# Patient Record
Sex: Male | Born: 1955 | Race: White | Hispanic: No | Marital: Married | Smoking: Never smoker
Health system: Southern US, Community
[De-identification: ages and names within clinical notes are randomized; demographics above are authoritative.]

## PROBLEM LIST (undated history)

## (undated) DIAGNOSIS — G4700 Insomnia, unspecified: Secondary | ICD-10-CM

## (undated) DIAGNOSIS — F320 Major depressive disorder, single episode, mild: Secondary | ICD-10-CM

## (undated) DIAGNOSIS — E559 Vitamin D deficiency, unspecified: Secondary | ICD-10-CM

## (undated) DIAGNOSIS — F32A Depression, unspecified: Secondary | ICD-10-CM

## (undated) HISTORY — DX: Vitamin D deficiency, unspecified: E55.9

## (undated) HISTORY — PX: REFRACTIVE SURGERY: SHX103

## (undated) HISTORY — DX: Major depressive disorder, single episode, mild: F32.0

## (undated) HISTORY — PX: VASECTOMY: SHX75

## (undated) HISTORY — DX: Insomnia, unspecified: G47.00

## (undated) HISTORY — PX: MOHS SURGERY: SUR867

## (undated) HISTORY — DX: Depression, unspecified: F32.A

---

## 2011-01-10 ENCOUNTER — Ambulatory Visit: Payer: Self-pay | Admitting: Thoracic Surgery

## 2016-09-19 ENCOUNTER — Ambulatory Visit (INDEPENDENT_AMBULATORY_CARE_PROVIDER_SITE_OTHER): Payer: 59 | Admitting: Neurology

## 2016-09-19 ENCOUNTER — Encounter: Payer: Self-pay | Admitting: Neurology

## 2016-09-19 VITALS — BP 110/61 | HR 54 | Resp 14 | Ht 68.0 in | Wt 160.0 lb

## 2016-09-19 DIAGNOSIS — M2619 Other specified anomalies of jaw-cranial base relationship: Secondary | ICD-10-CM | POA: Diagnosis not present

## 2016-09-19 DIAGNOSIS — G4733 Obstructive sleep apnea (adult) (pediatric): Secondary | ICD-10-CM

## 2016-09-19 DIAGNOSIS — G4700 Insomnia, unspecified: Secondary | ICD-10-CM

## 2016-09-19 DIAGNOSIS — G4734 Idiopathic sleep related nonobstructive alveolar hypoventilation: Secondary | ICD-10-CM | POA: Diagnosis not present

## 2016-09-19 DIAGNOSIS — G4730 Sleep apnea, unspecified: Secondary | ICD-10-CM

## 2016-09-19 NOTE — Patient Instructions (Signed)
I would like Scott Rhodes to try melatonin at a dose of less than 6 mg 30 minutes before intended bedtime. This should be taken for 14 days consecutively to entrain a prolonged sleep phase. The goal is to stretch the overnight sleep time rather than to initiate sleep in the first place.

## 2016-09-19 NOTE — Progress Notes (Signed)
SLEEP MEDICINE CLINIC   Provider:  Larey Seat, M D  Referring Provider: Crist Infante, MD Primary Care Physician:  Jerlyn Ly, MD  Chief Complaint  Patient presents with  . Sleep Consult    Rm 11. Patient reports that he has had 2 sleep studies in the past. Unable to tolerate CPAP, O2, or dental device.    Chief complaint according to patient : Falling asleep is not my issue, but staying asleep. My sleep is not very restorative, refreshing. I am sleepy during movies and conversations.   HPI:  Scott Rhodes is a 61 y.o. male , seen here as a referral from Dr. Joylene Draft for a sleep medicine consultation. Scott Rhodes had been evaluated twice for insomnia and sleep disordered breathing. His last sleep study was around 2010 and was performed at [facility]. I do not have records available. I do have a copy of the CPAP titration report from 02/26/2008 when Scott Rhodes was 61 years old and presented with a history of respiratory allergies, hypersomnia and OSA diagnosed in June of the same year with an AHI of 20.2. He returned for CPAP treatment. In addition I would like to mention that his Epworth Sleepiness Scale at the time was endorsed at 19 out of 24 points. CPAP was titrated from 4-8 cm water and the patient was able to sleep supine with an AHI of 0.0 he also could reach REM sleep at 9.7% of the night. There was no tachybradycardia arrhythmia noted, a full face mask was initially used in the sleep lab but had asked to replace these with a nasal pillow mask. Scott Rhodes used a CPAP for a while , but lost the interface. He switched to a mouth guard, which was manufactured by Dr. Oneal Grout. He would lose this one , too !   Sleep habits are as follows:  Mr. Lay bedtime is between 10 and 10:30 PM and he is usually asleep rather promptly. He describes his bedroom is cool, quiet and dark and there is usually no intrusion of any stimuli. He does not watch TV in the bedroom. He shares a bedroom  with his wife but she sometimes goes to another room. His wife has not described being bothered by snoring, kicking or any an active dream enactment. He will stay asleep for 4-5 hours is out interruption. From then on he will wake up in interval usually he cannot go back to sleep at all. He wakes up spontaneously it is not the urge to urinate that wakes him ,  he uses the time for a bathroom break. Episodic he will suffer some pain in the left hip, and that seems to be aggravated if he rests on his left side. He usually prefers to sleep on his side, but wakes up on his back. Uses nasal strips," breath rite ",  Sleeps on one pillow. Sleeps flat. For 10 years he has taken Remeron which helped him to get back to sleep a little easier but no longer. He still feels a difference if he does not take the medication, though. On average she only gets about 5 hours of nocturnal sleep , usually awake at 4 AM and  he has to rise in the morning at 5 AM, spontaneously. Does not need an alarm. While he does not feel refreshed or restored he does not have any discomfort or pain that keeps him from sleeping, no diaphoresis or palpitations, no chest pain or shortness of breath. His wife still  reports that he snores and has some apnea, but she never witnessed when he took a CPAP mask off or when he may have taken out his dental guard.  He does nap when he tries not to fall asleep- paradoxically.  Sleep medical history and family sleep history: No history of tonsil, adenoidectomy or septoplasty no neck surgery. He was not a sleep walker. His father had sleep apnea. His children are now in their 54s and never had any sleep problems.  Social history:  Used to work for a [employer]. Now baseball coach. Non smoker,  ETOH: 1-3 drinks 2 -3 nights a week, wine only.  Caffeine - one soda every 2-3 per month !  One cup of energy drink in AM> energy drinks ; none.  No history of shift work    Review of Systems: Out  of a complete 14 system review, the patient complains of only the following symptoms, and all other reviewed systems are negative. Snoring, insomnia, nocturia times 1.  Epworth score 18 , Fatigue severity score 33  , depression score n/ a  Social History   Social History  . Marital status: Married    Spouse name: N/A  . Number of children: 3  . Years of education: college   Occupational History  . Retired     Social History Main Topics  . Smoking status: Never Smoker  . Smokeless tobacco: Never Used  . Alcohol use Yes  . Drug use: No  . Sexual activity: Not on file   Other Topics Concern  . Not on file   Social History Narrative   Drinks 2 cups of coffee a day     No family history on file.  Past Medical History:  Diagnosis Date  . Insomnia   . Mild depression ([place])   . Vitamin D deficiency     Past Surgical History:  Procedure Laterality Date  . MOHS SURGERY    . REFRACTIVE SURGERY    . VASECTOMY      Current Outpatient Prescriptions  Medication Sig Dispense Refill  . mirtazapine (REMERON) 7.5 MG tablet Take 7.5 mg by mouth at bedtime.    . tamsulosin (FLOMAX) 0.4 MG CAPS capsule      No current facility-administered medications for this visit.     Allergies as of 09/19/2016  . (No Known Allergies)    Vitals: BP 110/61   Pulse (!) 54   Resp 14   Ht 5\' 8"  (1.727 m)   Wt 160 lb (72.6 kg)   BMI 24.33 kg/m  Last Weight:  Wt Readings from Last 1 Encounters:  09/19/16 160 lb (72.6 kg)   [id number] mass index is 24.33 kg/m.     Last Height:   Ht Readings from Last 1 Encounters:  09/19/16 5\' 8"  (1.727 m)    Physical exam:  General: The patient is awake, alert and appears not in acute distress. The patient is well groomed. Head: Normocephalic, atraumatic. Neck is supple. Mallampati 2,  neck circumference:15. Nasal airflow patent, but congested  , TMJ is evident . Retrognathia is seen.  Cardiovascular:  Regular rate and rhythm, without  murmurs or  carotid bruit, and without distended neck veins. Respiratory: Lungs are clear to auscultation. Skin:  Without evidence of edema, or rash Trunk: BMI is 24, erect posture.   Neurologic exam : The patient is awake and alert, oriented to place and time.   Memory subjective described as intact.  Attention span & concentration ability appears normal.  Speech is fluent,  without dysarthria, dysphonia or aphasia.  Mood and affect are appropriate.  Cranial nerves: Pupils are equal and briskly reactive to light. Funduscopic exam without evidence of pallor or edema.  Extraocular movements  in vertical and horizontal planes intact and without nystagmus. Visual fields by finger perimetry are intact. Hearing to finger rub intact.   Facial sensation intact to fine touch.  Facial motor strength is symmetric and tongue and uvula move midline. Shoulder shrug was symmetrical.   Motor exam: Normal tone, muscle bulk and symmetric strength in all extremities. Strong and bilaterally symmetric grip strength, intact pinch strength.  Sensory:  Fine touch, pinprick and vibration were tested in all extremities.  Proprioception tested in the upper extremities was normal.  Coordination: Rapid alternating movements and Finger-to-nose maneuver  normal without evidence of ataxia, dysmetria or tremor.  Gait and station: Patient walks without assistive device and is able unassisted to climb up to the exam table. Strength within normal limits.  Stance is stable and normal. Tandem gait is unfragmented. Turns with  3 Steps. Romberg testing is negative.  Deep tendon reflexes: in the  upper and lower extremities are symmetric and intact. Babinski maneuver deferred ,   The patient was advised of the nature of the diagnosed sleep disorder , the treatment options and risks for general a health and wellness arising from not treating the condition.  I spent more than 45 minutes of face to face time with the patient. Greater than  50% of time was spent in counseling and coordination of care. We have discussed the diagnosis and differential and I answered the patient's questions.     Assessment:  After physical and neurologic examination, review of laboratory studies,  Personal review of imaging studies, reports of other /same  Imaging studies ,  Results of polysomnography/ neurophysiology testing and pre-existing records as far as provided in visit., my assessment is   1)  Untreated sleep apnea, according to the last sleep study available from 2009. Mr. Bostic experiences high degrees of daytime sleepiness, fatigue and his wife still notice him to snore and sometimes have apnea. He does not have the physical markers of a typical sleep apnea patient, he is slender he has a very good muscle tone, his upper airway is not narrowed and his neck circumference is well within normal range. I do have very slender patient however that still have sleep apnea. I would like for him to be reevaluated for the current degree of sleep apnea, which may be less or more than it was in 2009 I also would encourage him to try melatonin instead of a prescription drug to help him sleep longer. Melatonin does not induce sleep but helps to maintain sleep. There also prescription drugs that can help to prolong sleep, especially SSRIs family's  new generations.   2) Mr. Cisney practices excellent sleep hygiene he does not watch TV in the bedroom his bedroom is cool, quiet and dark. He does not drink caffeine in the later hours of the day, he doesn't eat late and he exercises regularly.     Plan:  Treatment plan and additional workup : would like Mr. Jung to try melatonin at a dose of less than 6 mg 30 minutes before intended bedtime. This should be taken for 14 days consecutively to entrain a prolonged sleep phase. The goal is to stretch the overnight sleep time rather than to initiate sleep in the first place. Hypoxemia testing . Workup for apnea with  insomnia, goal is to improve sleep duration and sleep quality. If sleep apnea is again confirmed I would like for him to try the appropriate measures depending on : oxygen desaturation is present with apnea or if his apnea is REM sleep dependent. He has recollectionof a remote ONO at [facility] and oxygen trial, I will ask for records.  I would like for him to continue REMeron es needed.   SPLIT night PSG ordered.      Asencion Partridge Delano Frate MD  09/19/2016   CC: Crist Infante, [place],  [id number]

## 2016-10-03 ENCOUNTER — Ambulatory Visit (INDEPENDENT_AMBULATORY_CARE_PROVIDER_SITE_OTHER): Payer: 59 | Admitting: Neurology

## 2016-10-03 DIAGNOSIS — G4733 Obstructive sleep apnea (adult) (pediatric): Secondary | ICD-10-CM | POA: Diagnosis not present

## 2016-10-03 DIAGNOSIS — G4700 Insomnia, unspecified: Secondary | ICD-10-CM

## 2016-10-03 DIAGNOSIS — M2619 Other specified anomalies of jaw-cranial base relationship: Secondary | ICD-10-CM

## 2016-10-03 DIAGNOSIS — G4734 Idiopathic sleep related nonobstructive alveolar hypoventilation: Secondary | ICD-10-CM

## 2016-10-03 DIAGNOSIS — G4730 Sleep apnea, unspecified: Secondary | ICD-10-CM

## 2016-10-04 ENCOUNTER — Telehealth: Payer: Self-pay | Admitting: Neurology

## 2016-10-04 DIAGNOSIS — Z9989 Dependence on other enabling machines and devices: Principal | ICD-10-CM

## 2016-10-04 DIAGNOSIS — G4733 Obstructive sleep apnea (adult) (pediatric): Secondary | ICD-10-CM

## 2016-10-04 NOTE — Procedures (Signed)
[address] [place], [place] [address] NAME: Scott Rhodes     DOB: 04/27/1966 MEDICAL RECORD [id number]   DOS: 10/03/16 REFERRING PHYSICIAN: Crist Infante, MD Study performed: HST/Out of Center Sleep test HISTORY: HPI:  Scott Rhodes is a 61 year old male patient who has been evaluated twice for insomnia and sleep disordered breathing. His last sleep study was around 2010 and was performed at [facility]. I do have a copy of the CPAP titration report from 02/26/2008 when Scott Rhodes was 61 years old and presented with a history of respiratory allergies, hypersomnia and OSA , after being diagnosed in June of the same year with an AHI of 20.2. His Epworth Sleepiness Scale at the time was endorsed at 19 /24 points. CPAP was titrated from 4-8 cm water and the patient was able to sleep supine with an AHI of 0.0, he also could reach REM sleep at 9.7% of the night. There was no tachy-bradycardia or arrhythmia noted. A full face mask was initially used in the sleep lab but had asked to replace these with a nasal pillow mask. Scott Rhodes used a CPAP for a while, but lost the interface. He switched to a mouth guard, which was manufactured by Dr. Oneal Grout, but he would lose this one during the night , too. This HST is meant to re-establish a diagnosis.   STUDY RESULTS: Total Recording Time: 7hr 38min. Total Apnea/Hypopnea Index (AHI) 22.2/hr. Average Oxygen Saturation: 94% Spo2, Lowest Oxygen Saturation: 80% SpO2, with 8 minutes of hypoxia Average Heart Rate: 49 bpm/ regular,   IMPRESSION: OSA is still present. There is moderate sleep apnea recorded without clinically significant desaturation.  RECOMMENDATION: Dental device or CPAP are valid options, but an ENT procedure is an alternative. I certify that I have reviewed the raw data recording prior to the issuance of this report in accordance with the standards of Accreditation of the American Academy of Sleep medicine (AASM). Larey Seat, MD    10-04-2016 Diplomat, American Board of Psychiatry and Neurology, Diplomat, American Board of Sleep Medicine Medical Director of [facility] at [facility], an AASM accredited facility.

## 2016-10-04 NOTE — Telephone Encounter (Signed)
Hallo, Scott Rhodes  Patient has again tested positive for sleep apnea, moderate severity , no significant hypoxemia associated.  Lets suggest a nasal mask for the patient, he has tried FFM and pillows before, as well as dental devices.  Let me know if this is acceptable to him   [facility], MD

## 2016-10-09 NOTE — Telephone Encounter (Signed)
Patient is willing to try the nasal mask, please placed order. Patient asked that we mail the order to him. I confirmed address.

## 2016-10-09 NOTE — Addendum Note (Signed)
Addended by: Larey Seat on: 10/09/2016 10:05 AM   Modules accepted: Orders

## 2016-10-09 NOTE — Telephone Encounter (Signed)
Eson nasal mask, fisher and paykel medium or small ? , fit to patient .

## 2016-10-09 NOTE — Telephone Encounter (Signed)
Order printed and I will mail to patient per patient request

## 2018-11-13 DIAGNOSIS — H52223 Regular astigmatism, bilateral: Secondary | ICD-10-CM | POA: Diagnosis not present

## 2018-11-13 DIAGNOSIS — H43823 Vitreomacular adhesion, bilateral: Secondary | ICD-10-CM | POA: Diagnosis not present

## 2018-11-13 DIAGNOSIS — H5203 Hypermetropia, bilateral: Secondary | ICD-10-CM | POA: Diagnosis not present

## 2018-11-13 DIAGNOSIS — H35371 Puckering of macula, right eye: Secondary | ICD-10-CM | POA: Diagnosis not present

## 2018-11-13 DIAGNOSIS — H524 Presbyopia: Secondary | ICD-10-CM | POA: Diagnosis not present

## 2019-01-21 DIAGNOSIS — L570 Actinic keratosis: Secondary | ICD-10-CM | POA: Diagnosis not present

## 2019-01-21 DIAGNOSIS — Z85828 Personal history of other malignant neoplasm of skin: Secondary | ICD-10-CM | POA: Diagnosis not present

## 2019-01-21 DIAGNOSIS — L814 Other melanin hyperpigmentation: Secondary | ICD-10-CM | POA: Diagnosis not present

## 2019-01-21 DIAGNOSIS — D044 Carcinoma in situ of skin of scalp and neck: Secondary | ICD-10-CM | POA: Diagnosis not present

## 2019-01-21 DIAGNOSIS — Z125 Encounter for screening for malignant neoplasm of prostate: Secondary | ICD-10-CM | POA: Diagnosis not present

## 2019-01-21 DIAGNOSIS — R82998 Other abnormal findings in urine: Secondary | ICD-10-CM | POA: Diagnosis not present

## 2019-01-21 DIAGNOSIS — L821 Other seborrheic keratosis: Secondary | ICD-10-CM | POA: Diagnosis not present

## 2019-01-21 DIAGNOSIS — D485 Neoplasm of uncertain behavior of skin: Secondary | ICD-10-CM | POA: Diagnosis not present

## 2019-01-21 DIAGNOSIS — Z0000 Encounter for general adult medical examination without abnormal findings: Secondary | ICD-10-CM | POA: Diagnosis not present

## 2019-01-21 DIAGNOSIS — E559 Vitamin D deficiency, unspecified: Secondary | ICD-10-CM | POA: Diagnosis not present

## 2019-01-21 DIAGNOSIS — D225 Melanocytic nevi of trunk: Secondary | ICD-10-CM | POA: Diagnosis not present

## 2019-01-21 DIAGNOSIS — C44619 Basal cell carcinoma of skin of left upper limb, including shoulder: Secondary | ICD-10-CM | POA: Diagnosis not present

## 2019-01-26 DIAGNOSIS — H9190 Unspecified hearing loss, unspecified ear: Secondary | ICD-10-CM | POA: Diagnosis not present

## 2019-01-26 DIAGNOSIS — G4733 Obstructive sleep apnea (adult) (pediatric): Secondary | ICD-10-CM | POA: Diagnosis not present

## 2019-01-26 DIAGNOSIS — G4700 Insomnia, unspecified: Secondary | ICD-10-CM | POA: Diagnosis not present

## 2019-01-26 DIAGNOSIS — Z1331 Encounter for screening for depression: Secondary | ICD-10-CM | POA: Diagnosis not present

## 2019-01-26 DIAGNOSIS — Z0000 Encounter for general adult medical examination without abnormal findings: Secondary | ICD-10-CM | POA: Diagnosis not present

## 2019-01-26 DIAGNOSIS — R0901 Asphyxia: Secondary | ICD-10-CM | POA: Diagnosis not present

## 2019-01-26 DIAGNOSIS — E559 Vitamin D deficiency, unspecified: Secondary | ICD-10-CM | POA: Diagnosis not present

## 2019-01-26 DIAGNOSIS — D72819 Decreased white blood cell count, unspecified: Secondary | ICD-10-CM | POA: Diagnosis not present

## 2019-01-26 DIAGNOSIS — F329 Major depressive disorder, single episode, unspecified: Secondary | ICD-10-CM | POA: Diagnosis not present

## 2019-01-26 DIAGNOSIS — Z801 Family history of malignant neoplasm of trachea, bronchus and lung: Secondary | ICD-10-CM | POA: Diagnosis not present

## 2019-02-12 DIAGNOSIS — C44619 Basal cell carcinoma of skin of left upper limb, including shoulder: Secondary | ICD-10-CM | POA: Diagnosis not present

## 2019-02-27 MED FILL — MIRTAZAPINE 15 MG TABLET: 15 | 90 days supply | Qty: 90 | Fill #0

## 2019-05-21 DIAGNOSIS — Z20828 Contact with and (suspected) exposure to other viral communicable diseases: Secondary | ICD-10-CM | POA: Diagnosis not present

## 2019-05-27 MED FILL — MIRTAZAPINE 15 MG TABLET: 15 | 90 days supply | Qty: 90 | Fill #1

## 2019-05-28 DIAGNOSIS — Z23 Encounter for immunization: Secondary | ICD-10-CM | POA: Diagnosis not present

## 2019-05-28 DIAGNOSIS — D72819 Decreased white blood cell count, unspecified: Secondary | ICD-10-CM | POA: Diagnosis not present

## 2019-08-25 MED FILL — MIRTAZAPINE 15 MG TABLET: 15 | 90 days supply | Qty: 90 | Fill #2

## 2019-09-07 DIAGNOSIS — M25511 Pain in right shoulder: Secondary | ICD-10-CM | POA: Diagnosis not present

## 2019-09-07 DIAGNOSIS — M67911 Unspecified disorder of synovium and tendon, right shoulder: Secondary | ICD-10-CM | POA: Diagnosis not present

## 2019-11-12 DIAGNOSIS — H524 Presbyopia: Secondary | ICD-10-CM | POA: Diagnosis not present

## 2019-11-12 DIAGNOSIS — H5202 Hypermetropia, left eye: Secondary | ICD-10-CM | POA: Diagnosis not present

## 2019-11-12 DIAGNOSIS — H2513 Age-related nuclear cataract, bilateral: Secondary | ICD-10-CM | POA: Diagnosis not present

## 2019-11-12 DIAGNOSIS — H52223 Regular astigmatism, bilateral: Secondary | ICD-10-CM | POA: Diagnosis not present

## 2019-11-12 DIAGNOSIS — H11151 Pinguecula, right eye: Secondary | ICD-10-CM | POA: Diagnosis not present

## 2019-11-12 DIAGNOSIS — H1045 Other chronic allergic conjunctivitis: Secondary | ICD-10-CM | POA: Diagnosis not present

## 2019-11-12 DIAGNOSIS — H25813 Combined forms of age-related cataract, bilateral: Secondary | ICD-10-CM | POA: Diagnosis not present

## 2019-11-27 MED FILL — MIRTAZAPINE 15 MG TABLET: 15 | 90 days supply | Qty: 90 | Fill #3

## 2019-12-08 MED FILL — MIRTAZAPINE 15 MG TABLET: 15 | 90 days supply | Qty: 90 | Fill #3

## 2020-01-21 DIAGNOSIS — L578 Other skin changes due to chronic exposure to nonionizing radiation: Secondary | ICD-10-CM | POA: Diagnosis not present

## 2020-01-21 DIAGNOSIS — L814 Other melanin hyperpigmentation: Secondary | ICD-10-CM | POA: Diagnosis not present

## 2020-01-21 DIAGNOSIS — D485 Neoplasm of uncertain behavior of skin: Secondary | ICD-10-CM | POA: Diagnosis not present

## 2020-01-21 DIAGNOSIS — D225 Melanocytic nevi of trunk: Secondary | ICD-10-CM | POA: Diagnosis not present

## 2020-01-21 DIAGNOSIS — L570 Actinic keratosis: Secondary | ICD-10-CM | POA: Diagnosis not present

## 2020-01-21 DIAGNOSIS — L820 Inflamed seborrheic keratosis: Secondary | ICD-10-CM | POA: Diagnosis not present

## 2020-01-21 DIAGNOSIS — L821 Other seborrheic keratosis: Secondary | ICD-10-CM | POA: Diagnosis not present

## 2020-01-21 DIAGNOSIS — Z85828 Personal history of other malignant neoplasm of skin: Secondary | ICD-10-CM | POA: Diagnosis not present

## 2020-03-02 DIAGNOSIS — C44612 Basal cell carcinoma of skin of right upper limb, including shoulder: Secondary | ICD-10-CM | POA: Diagnosis not present

## 2020-03-02 DIAGNOSIS — L570 Actinic keratosis: Secondary | ICD-10-CM | POA: Diagnosis not present

## 2020-03-09 DIAGNOSIS — E559 Vitamin D deficiency, unspecified: Secondary | ICD-10-CM | POA: Diagnosis not present

## 2020-03-09 DIAGNOSIS — Z125 Encounter for screening for malignant neoplasm of prostate: Secondary | ICD-10-CM | POA: Diagnosis not present

## 2020-03-09 DIAGNOSIS — R7989 Other specified abnormal findings of blood chemistry: Secondary | ICD-10-CM | POA: Diagnosis not present

## 2020-03-09 DIAGNOSIS — Z0000 Encounter for general adult medical examination without abnormal findings: Secondary | ICD-10-CM | POA: Diagnosis not present

## 2020-03-16 DIAGNOSIS — R6882 Decreased libido: Secondary | ICD-10-CM | POA: Diagnosis not present

## 2020-03-16 DIAGNOSIS — E785 Hyperlipidemia, unspecified: Secondary | ICD-10-CM | POA: Diagnosis not present

## 2020-03-16 DIAGNOSIS — G4733 Obstructive sleep apnea (adult) (pediatric): Secondary | ICD-10-CM | POA: Diagnosis not present

## 2020-03-16 DIAGNOSIS — Z0000 Encounter for general adult medical examination without abnormal findings: Secondary | ICD-10-CM | POA: Diagnosis not present

## 2020-03-16 DIAGNOSIS — R82998 Other abnormal findings in urine: Secondary | ICD-10-CM | POA: Diagnosis not present

## 2020-03-16 DIAGNOSIS — H3530 Unspecified macular degeneration: Secondary | ICD-10-CM | POA: Diagnosis not present

## 2020-03-16 DIAGNOSIS — E559 Vitamin D deficiency, unspecified: Secondary | ICD-10-CM | POA: Diagnosis not present

## 2020-03-16 DIAGNOSIS — Z801 Family history of malignant neoplasm of trachea, bronchus and lung: Secondary | ICD-10-CM | POA: Diagnosis not present

## 2020-03-16 DIAGNOSIS — D72819 Decreased white blood cell count, unspecified: Secondary | ICD-10-CM | POA: Diagnosis not present

## 2020-03-17 ENCOUNTER — Other Ambulatory Visit: Payer: Self-pay | Admitting: Internal Medicine

## 2020-03-17 DIAGNOSIS — Z1212 Encounter for screening for malignant neoplasm of rectum: Secondary | ICD-10-CM | POA: Diagnosis not present

## 2020-03-17 DIAGNOSIS — E785 Hyperlipidemia, unspecified: Secondary | ICD-10-CM

## 2020-03-30 ENCOUNTER — Other Ambulatory Visit: Payer: Self-pay

## 2020-03-30 ENCOUNTER — Ambulatory Visit
Admission: RE | Admit: 2020-03-30 | Discharge: 2020-03-30 | Disposition: A | Discharge status: Home / Self Care | Payer: Self-pay | Source: Ambulatory Visit | Attending: Internal Medicine | Admitting: Internal Medicine

## 2020-03-30 DIAGNOSIS — E785 Hyperlipidemia, unspecified: Secondary | ICD-10-CM

## 2020-04-01 ENCOUNTER — Other Ambulatory Visit (HOSPITAL_COMMUNITY): Payer: Self-pay | Admitting: Internal Medicine

## 2020-04-01 MED FILL — ROSUVASTATIN CALCIUM 10 MG: 10 | 30 days supply | Qty: 30 | Fill #0

## 2020-04-21 DIAGNOSIS — R5081 Fever presenting with conditions classified elsewhere: Secondary | ICD-10-CM | POA: Diagnosis not present

## 2020-04-21 DIAGNOSIS — U071 COVID-19: Secondary | ICD-10-CM | POA: Diagnosis not present

## 2020-04-21 DIAGNOSIS — R05 Cough: Secondary | ICD-10-CM | POA: Diagnosis not present

## 2020-04-22 ENCOUNTER — Other Ambulatory Visit (HOSPITAL_COMMUNITY): Payer: Self-pay | Admitting: Nurse Practitioner

## 2020-04-22 ENCOUNTER — Ambulatory Visit (HOSPITAL_COMMUNITY)
Admission: RE | Admit: 2020-04-22 | Discharge: 2020-04-22 | Disposition: A | Discharge status: Home / Self Care | Payer: 59 | Source: Ambulatory Visit | Attending: Pulmonary Disease | Admitting: Pulmonary Disease

## 2020-04-22 DIAGNOSIS — U071 COVID-19: Secondary | ICD-10-CM

## 2020-04-22 MED ORDER — SODIUM CHLORIDE 0.9 % IV SOLN
1200.0000 mg | Freq: Once | INTRAVENOUS | Status: AC
  Administered 2020-04-22: 1200 mg via INTRAVENOUS
  Filled 2020-04-22: qty 10

## 2020-04-22 MED ORDER — DIPHENHYDRAMINE HCL 50 MG/ML IJ SOLN: 50.0000 mg | Freq: Once | INTRAMUSCULAR | Status: DC | PRN

## 2020-04-22 MED ORDER — ACETAMINOPHEN 325 MG PO TABS
650.0000 mg | ORAL_TABLET | Freq: Four times a day (QID) | ORAL | Status: DC | PRN
  Administered 2020-04-22: 650 mg via ORAL
  Filled 2020-04-22: qty 2

## 2020-04-22 MED ORDER — FAMOTIDINE IN NACL 20-0.9 MG/50ML-% IV SOLN: 20.0000 mg | Freq: Once | INTRAVENOUS | Status: DC | PRN

## 2020-04-22 MED ORDER — SODIUM CHLORIDE 0.9 % IV SOLN: INTRAVENOUS | Status: DC | PRN

## 2020-04-22 MED ORDER — ALBUTEROL SULFATE HFA 108 (90 BASE) MCG/ACT IN AERS: 2.0000 | INHALATION_SPRAY | Freq: Once | RESPIRATORY_TRACT | Status: DC | PRN

## 2020-04-22 MED ORDER — EPINEPHRINE 0.3 MG/0.3ML IJ SOAJ: 0.3000 mg | Freq: Once | INTRAMUSCULAR | Status: DC | PRN

## 2020-04-22 MED ORDER — METHYLPREDNISOLONE SODIUM SUCC 125 MG IJ SOLR: 125.0000 mg | Freq: Once | INTRAMUSCULAR | Status: DC | PRN

## 2020-04-22 NOTE — Progress Notes (Signed)
I connected by phone with Scott Rhodes on 04/22/2020 at 8:54 AM to discuss the potential use of a new treatment for mild to moderate COVID-19 viral infection in non-hospitalized patients.  This patient is a 64 y.o. male that meets the FDA criteria for Emergency Use Authorization of COVID monoclonal antibody casirivimab/imdevimab.  Has a (+) direct SARS-CoV-2 viral test result  Has mild or moderate COVID-19   Is NOT hospitalized due to COVID-19  Is within 10 days of symptom onset (04/20/20)   Has at least one of the high risk factor(s) for progression to severe COVID-19 and/or hospitalization as defined in EUA.  Specific high risk criteria : BMI > 25   I have spoken and communicated the following to the patient or parent/caregiver regarding COVID monoclonal antibody treatment:  1. FDA has authorized the emergency use for the treatment of mild to moderate COVID-19 in adults and pediatric patients with positive results of direct SARS-CoV-2 viral testing who are 71 years of age and older weighing at least 40 kg, and who are at high risk for progressing to severe COVID-19 and/or hospitalization.  2. The significant known and potential risks and benefits of COVID monoclonal antibody, and the extent to which such potential risks and benefits are unknown.  3. Information on available alternative treatments and the risks and benefits of those alternatives, including clinical trials.  4. Patients treated with COVID monoclonal antibody should continue to self-isolate and use infection control measures (e.g., wear mask, isolate, social distance, avoid sharing personal items, clean and disinfect "high touch" surfaces, and frequent handwashing) according to CDC guidelines.   5. The patient or parent/caregiver has the option to accept or refuse COVID monoclonal antibody treatment.  After reviewing this information with the patient, The patient agreed to proceed with receiving casirivimab\imdevimab  infusion and will be provided a copy of the Fact sheet prior to receiving the infusion. [name] 04/22/2020 8:54 AM

## 2020-04-22 NOTE — Progress Notes (Signed)
  Diagnosis: COVID-19  Physician:  Procedure: [facility] Infusion [facility] Med: casirivimab\imdevimab infusion - Provided patient with casirivimab\imdevimab fact sheet for patients, parents and caregivers prior to infusion.  Complications: No immediate complications noted. Patient has a low HR but states this is his baseline HR and he feels fine.   Discharge: Discharged home   Chyrel Masson 04/22/2020

## 2020-04-22 NOTE — Discharge Instructions (Signed)

## 2020-04-26 MED FILL — ROSUVASTATIN CALCIUM 10 MG: 10 | 30 days supply | Qty: 30 | Fill #1

## 2020-05-26 MED FILL — ROSUVASTATIN CALCIUM 10 MG: 10 | 30 days supply | Qty: 30 | Fill #2

## 2020-06-09 ENCOUNTER — Other Ambulatory Visit (HOSPITAL_COMMUNITY): Payer: Self-pay | Admitting: Internal Medicine

## 2020-06-09 MED FILL — FLUARIX QUADRIVALENT 0.5 ML: 0.5 | 1 days supply | Qty: 1 | Fill #0

## 2020-07-01 MED FILL — ROSUVASTATIN CALCIUM 10 MG: 10 | 30 days supply | Qty: 30 | Fill #3

## 2020-07-27 DIAGNOSIS — Z1211 Encounter for screening for malignant neoplasm of colon: Secondary | ICD-10-CM | POA: Diagnosis not present

## 2020-07-27 DIAGNOSIS — Z1212 Encounter for screening for malignant neoplasm of rectum: Secondary | ICD-10-CM | POA: Diagnosis not present

## 2020-08-01 MED FILL — ROSUVASTATIN CALCIUM 10 MG: 10 | 30 days supply | Qty: 30 | Fill #4

## 2020-08-07 LAB — COLOGUARD: COLOGUARD: NEGATIVE

## 2020-08-07 LAB — EXTERNAL GENERIC LAB PROCEDURE: COLOGUARD: NEGATIVE

## 2020-08-11 DIAGNOSIS — E785 Hyperlipidemia, unspecified: Secondary | ICD-10-CM | POA: Diagnosis not present

## 2020-08-11 DIAGNOSIS — Z7689 Persons encountering health services in other specified circumstances: Secondary | ICD-10-CM | POA: Diagnosis not present

## 2020-08-15 ENCOUNTER — Other Ambulatory Visit (HOSPITAL_COMMUNITY): Payer: Self-pay | Admitting: Internal Medicine

## 2020-08-15 MED FILL — ESZOPICLONE 3 MG TABS: 3 | 30 days supply | Qty: 30 | Fill #0

## 2020-08-15 MED FILL — TAMSULOSIN HCL 0.4 MG CAP: 0.4 | 90 days supply | Qty: 90 | Fill #0

## 2020-09-08 ENCOUNTER — Other Ambulatory Visit (HOSPITAL_COMMUNITY): Payer: Self-pay | Admitting: Internal Medicine

## 2020-09-08 MED FILL — ROSUVASTATIN CALCIUM 10 MG: 10 | 30 days supply | Qty: 30 | Fill #5

## 2020-09-08 MED FILL — MIRTAZAPINE 15 MG TABS: 15 | 30 days supply | Qty: 30 | Fill #0

## 2020-10-04 MED FILL — ROSUVASTATIN CALCIUM 10 MG: 10 | 30 days supply | Qty: 30 | Fill #6

## 2020-10-04 MED FILL — MIRTAZAPINE 15 MG TABS: 15 | 30 days supply | Qty: 30 | Fill #1

## 2020-11-01 ENCOUNTER — Other Ambulatory Visit (HOSPITAL_BASED_OUTPATIENT_CLINIC_OR_DEPARTMENT_OTHER): Payer: Self-pay

## 2020-11-02 ENCOUNTER — Other Ambulatory Visit (HOSPITAL_COMMUNITY): Payer: Self-pay | Admitting: Internal Medicine

## 2020-11-02 MED FILL — ROSUVASTATIN CALCIUM 10 MG: 10 | 90 days supply | Qty: 90 | Fill #0

## 2020-11-02 MED FILL — MIRTAZAPINE 15 MG TABS: 15 | 90 days supply | Qty: 90 | Fill #0

## 2020-11-02 MED FILL — ESZOPICLONE 3 MG TABS: 3 | 30 days supply | Qty: 30 | Fill #1

## 2020-11-24 DIAGNOSIS — H25013 Cortical age-related cataract, bilateral: Secondary | ICD-10-CM | POA: Diagnosis not present

## 2020-11-24 DIAGNOSIS — H524 Presbyopia: Secondary | ICD-10-CM | POA: Diagnosis not present

## 2020-11-29 MED FILL — Tamsulosin HCl Cap 0.4 MG: ORAL | 90 days supply | Qty: 90 | Fill #0 | Status: AC

## 2020-11-30 ENCOUNTER — Other Ambulatory Visit (HOSPITAL_COMMUNITY): Payer: Self-pay

## 2020-12-06 ENCOUNTER — Other Ambulatory Visit (HOSPITAL_COMMUNITY): Payer: Self-pay

## 2021-01-23 DIAGNOSIS — L821 Other seborrheic keratosis: Secondary | ICD-10-CM | POA: Diagnosis not present

## 2021-01-23 DIAGNOSIS — L570 Actinic keratosis: Secondary | ICD-10-CM | POA: Diagnosis not present

## 2021-01-23 DIAGNOSIS — L814 Other melanin hyperpigmentation: Secondary | ICD-10-CM | POA: Diagnosis not present

## 2021-01-23 DIAGNOSIS — D225 Melanocytic nevi of trunk: Secondary | ICD-10-CM | POA: Diagnosis not present

## 2021-01-23 DIAGNOSIS — D485 Neoplasm of uncertain behavior of skin: Secondary | ICD-10-CM | POA: Diagnosis not present

## 2021-01-23 DIAGNOSIS — Z85828 Personal history of other malignant neoplasm of skin: Secondary | ICD-10-CM | POA: Diagnosis not present

## 2021-01-23 DIAGNOSIS — C4441 Basal cell carcinoma of skin of scalp and neck: Secondary | ICD-10-CM | POA: Diagnosis not present

## 2021-01-23 DIAGNOSIS — L578 Other skin changes due to chronic exposure to nonionizing radiation: Secondary | ICD-10-CM | POA: Diagnosis not present

## 2021-02-01 MED FILL — Rosuvastatin Calcium Tab 10 MG: ORAL | 90 days supply | Qty: 90 | Fill #0 | Status: AC

## 2021-02-01 MED FILL — Mirtazapine Tab 15 MG: ORAL | 90 days supply | Qty: 90 | Fill #0 | Status: AC

## 2021-02-02 ENCOUNTER — Other Ambulatory Visit (HOSPITAL_COMMUNITY): Payer: Self-pay

## 2021-03-04 MED FILL — Tamsulosin HCl Cap 0.4 MG: ORAL | 90 days supply | Qty: 90 | Fill #1 | Status: AC

## 2021-03-06 ENCOUNTER — Other Ambulatory Visit (HOSPITAL_COMMUNITY): Payer: Self-pay

## 2021-04-30 IMAGING — CT CT CARDIAC CORONARY ARTERY CALCIUM SCORE
3 series · 12 of 20 positions shown, 14 images · non-contrast
Comparison: None.

CLINICAL DATA: Screening exam.

EXAM:
CT CARDIAC CORONARY ARTERY CALCIUM SCORE
TECHNIQUE: Non-contrast imaging through the heart was performed using
prospective ECG gating. Image post processing was performed on an
independent workstation, allowing for quantitative analysis of the
heart and coronary arteries. Note that this exam targets the heart
and the chest was not imaged in its entirety.

[Series 2: calcium scoring 2.00 qr36 bestdiast 72% hrt calciu · axial · 0.34mm/px · z∈[+1530,+1562]mm · 2 of 80 slices shown]
[im 16/80  vessel]
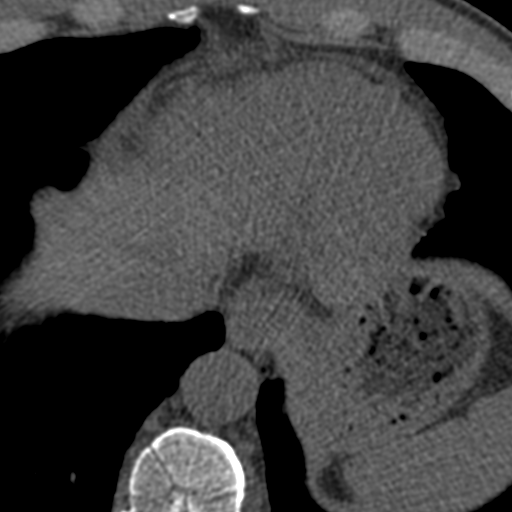
[im 32/80  vessel]
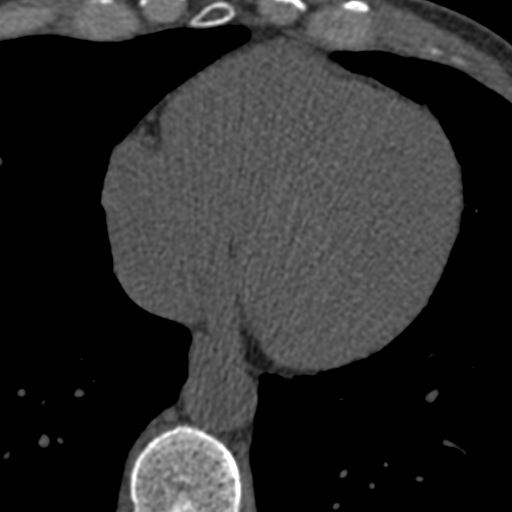

[Series 3: calcium scoring 2.00 br40 bestdiast 72% axial · axial · 0.51mm/px · z∈[+1526,+1630]mm · 5 of 80 slices shown, 7 images]
[im 14/80  vessel]
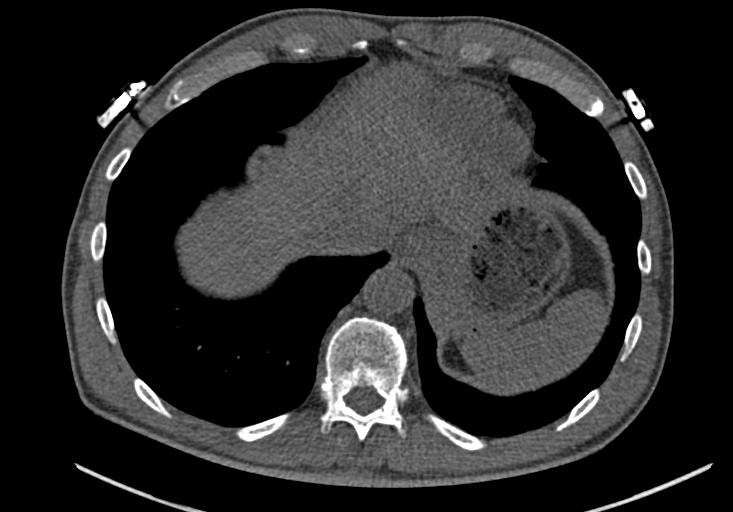
[im 14/80  lung]
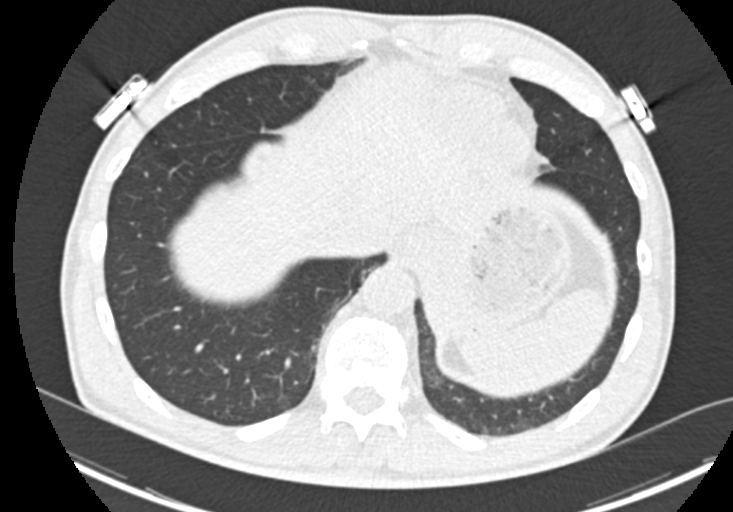
[im 27/80  vessel]
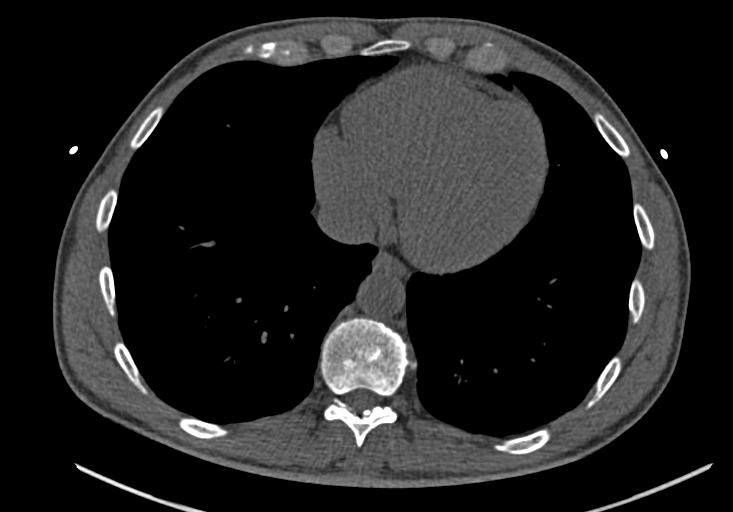
[im 40/80  vessel]
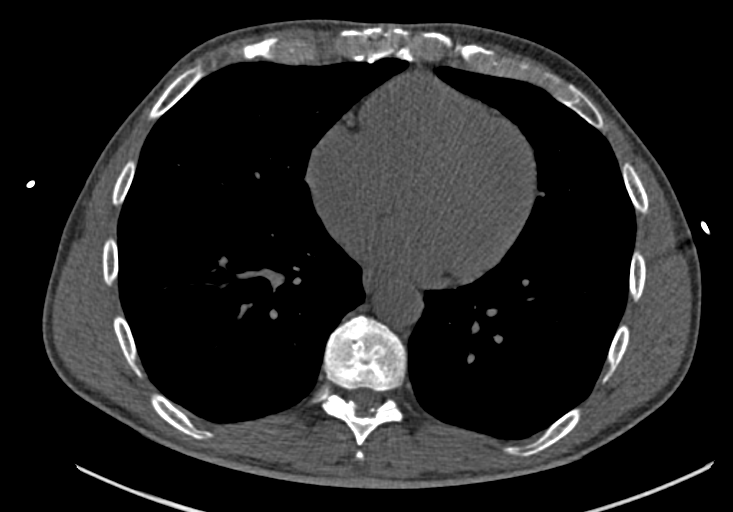
[im 53/80  vessel]
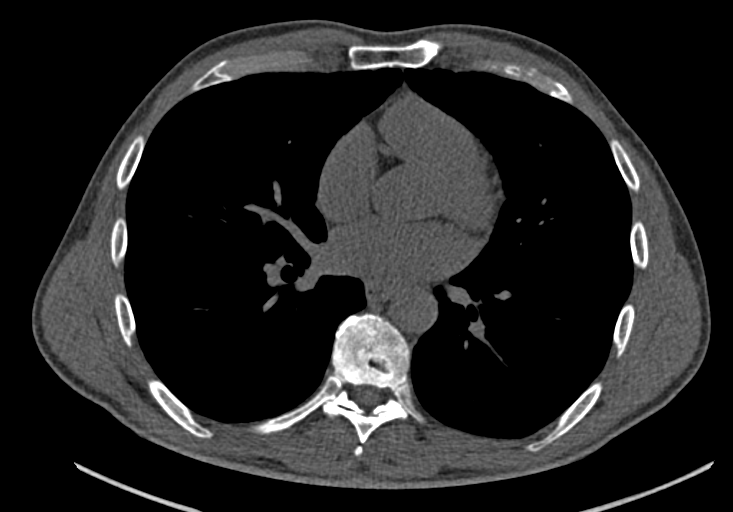
[im 66/80  vessel]
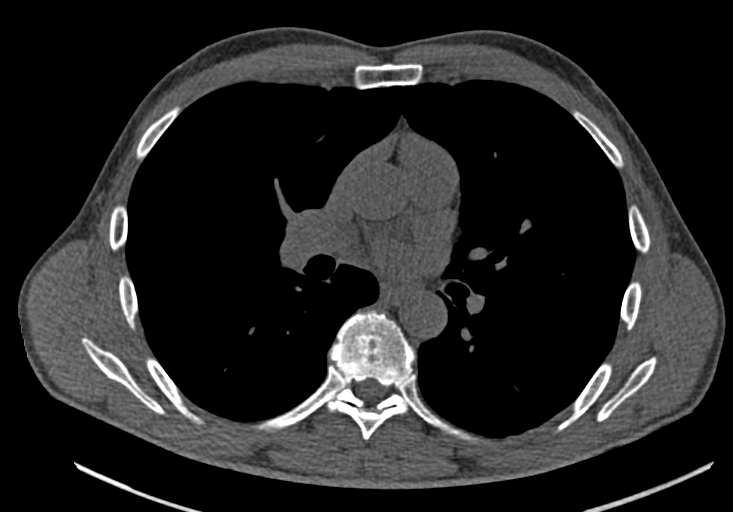
[im 66/80  lung]
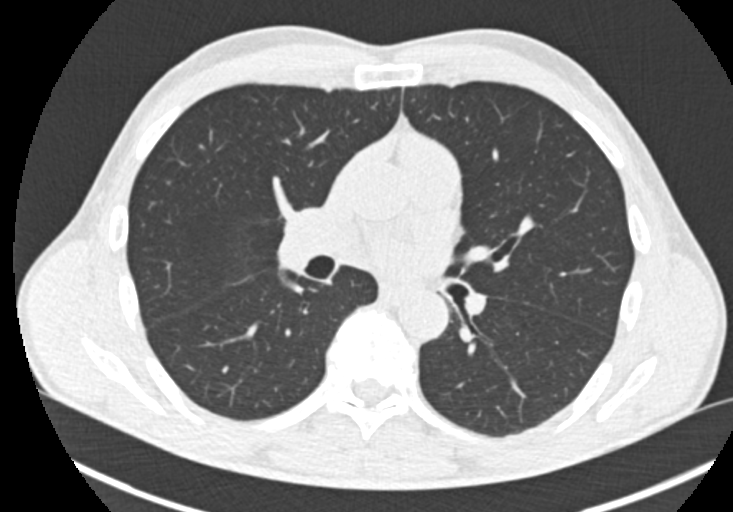

[Series 9: calcium scoring 2.00 br60 bestdiast 72% lungs · axial · 0.51mm/px · z∈[+1526,+1630]mm · 5 of 80 slices shown]
[im 14/80  vessel]
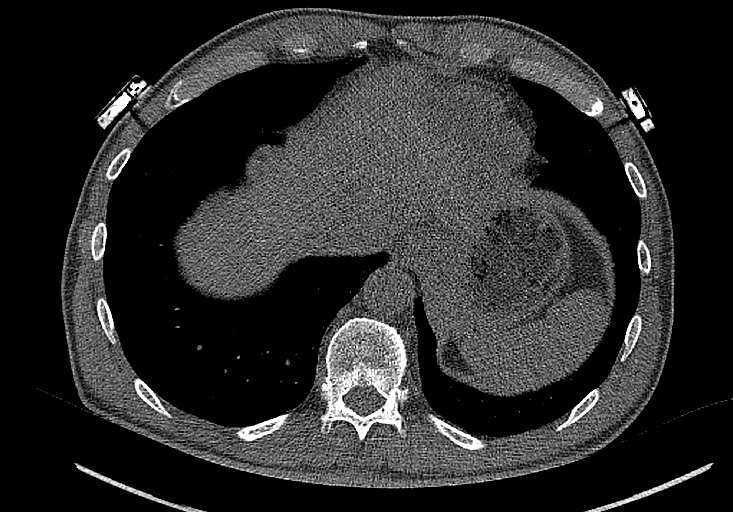
[im 27/80  vessel]
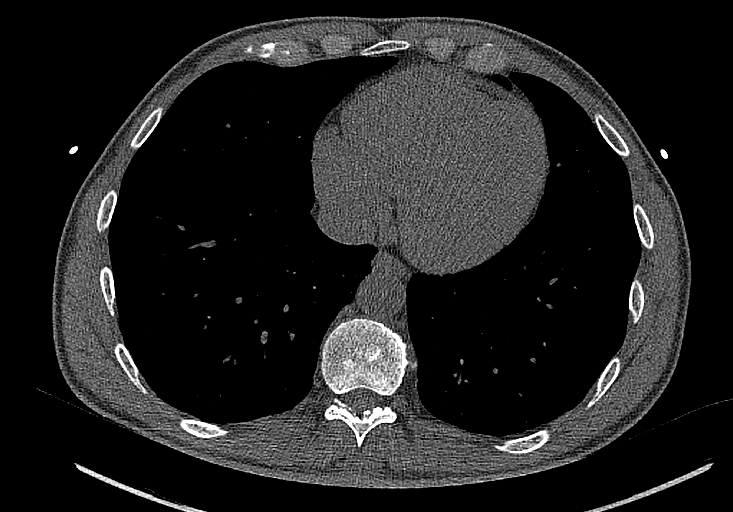
[im 40/80  vessel]
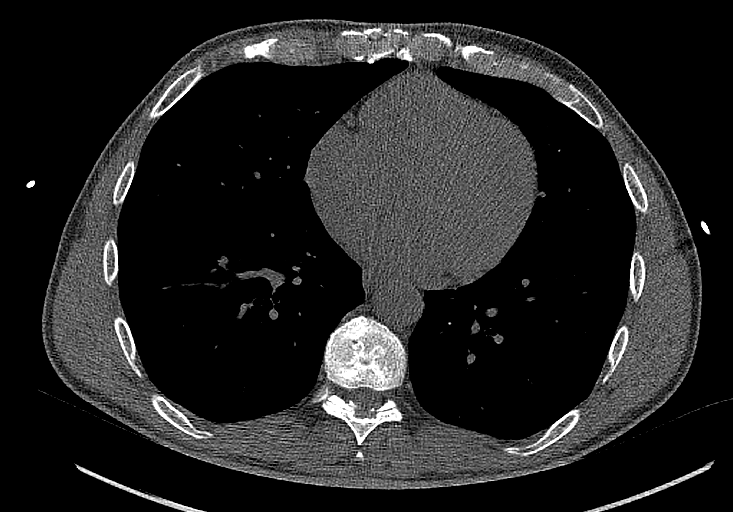
[im 53/80  vessel]
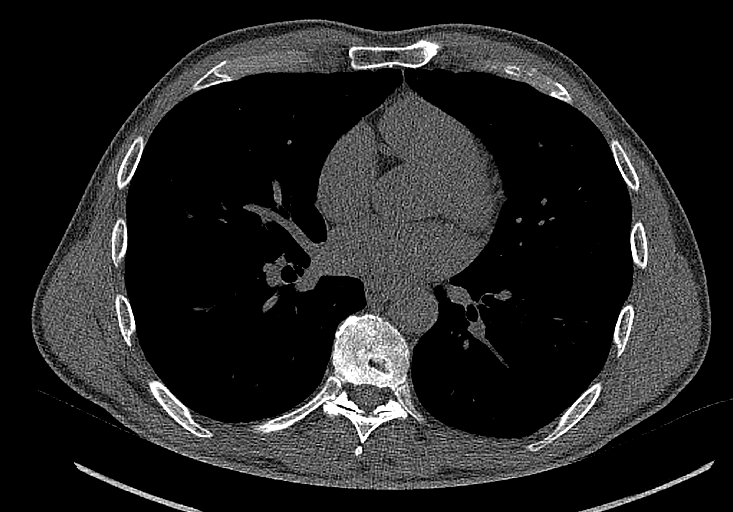
[im 66/80  vessel]
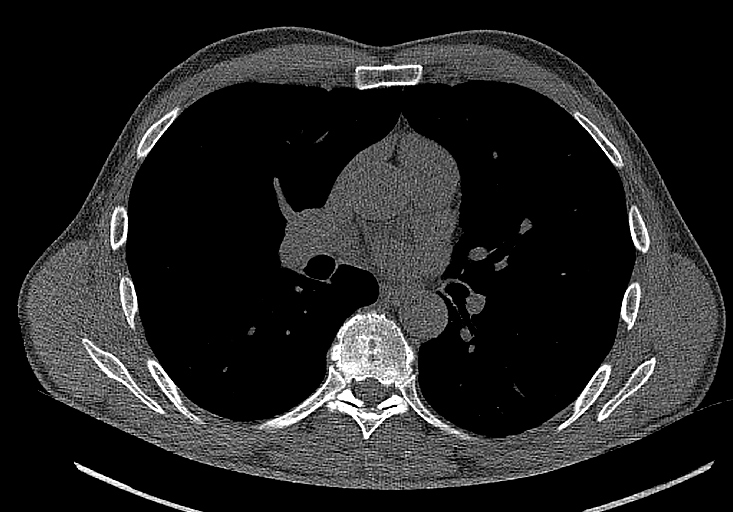

[12 of 20 positions shown; findings below may reference images not displayed]

FINDINGS: CORONARY CALCIUM SCORES:

Left Main: 0

LAD:

LCx: 0

RCA:

Total Agatston Score:

[HOSPITAL] percentile: 45th

AORTA MEASUREMENTS:

Ascending Aorta: 30 mm

Descending Aorta: 23 mm

OTHER FINDINGS:

Cardiovascular: Normal aortic caliber. Normal heart size, without
pericardial effusion.

Mediastinum/Nodes: No imaged thoracic adenopathy.

Lungs/Pleura: No pleural fluid.  Clear imaged lungs.

Upper Abdomen: Normal imaged portions of the liver, spleen, stomach.

Musculoskeletal: Moderate thoracic spondylosis.
IMPRESSION: Total Agatston score of 36.4, corresponding to 45th percentile for
age, sex, and race based cohort.

## 2021-05-01 ENCOUNTER — Other Ambulatory Visit (HOSPITAL_COMMUNITY): Payer: Self-pay

## 2021-05-01 MED ORDER — ESZOPICLONE 3 MG PO TABS
ORAL_TABLET | ORAL | 3 refills | Status: DC
  Filled 2021-05-01: qty 30, 30d supply, fill #0
  Filled 2021-07-17: qty 30, 30d supply, fill #1

## 2021-05-01 MED FILL — Rosuvastatin Calcium Tab 10 MG: ORAL | 90 days supply | Qty: 90 | Fill #1 | Status: AC

## 2021-05-01 MED FILL — Mirtazapine Tab 15 MG: ORAL | 90 days supply | Qty: 90 | Fill #1 | Status: AC

## 2021-05-23 DIAGNOSIS — E559 Vitamin D deficiency, unspecified: Secondary | ICD-10-CM | POA: Diagnosis not present

## 2021-05-23 DIAGNOSIS — E785 Hyperlipidemia, unspecified: Secondary | ICD-10-CM | POA: Diagnosis not present

## 2021-05-23 DIAGNOSIS — Z125 Encounter for screening for malignant neoplasm of prostate: Secondary | ICD-10-CM | POA: Diagnosis not present

## 2021-05-26 DIAGNOSIS — G4733 Obstructive sleep apnea (adult) (pediatric): Secondary | ICD-10-CM | POA: Diagnosis not present

## 2021-05-26 DIAGNOSIS — E785 Hyperlipidemia, unspecified: Secondary | ICD-10-CM | POA: Diagnosis not present

## 2021-05-26 DIAGNOSIS — Z23 Encounter for immunization: Secondary | ICD-10-CM | POA: Diagnosis not present

## 2021-05-26 DIAGNOSIS — I2510 Atherosclerotic heart disease of native coronary artery without angina pectoris: Secondary | ICD-10-CM | POA: Diagnosis not present

## 2021-05-26 DIAGNOSIS — D72819 Decreased white blood cell count, unspecified: Secondary | ICD-10-CM | POA: Diagnosis not present

## 2021-05-26 DIAGNOSIS — Z1331 Encounter for screening for depression: Secondary | ICD-10-CM | POA: Diagnosis not present

## 2021-05-26 DIAGNOSIS — Z0000 Encounter for general adult medical examination without abnormal findings: Secondary | ICD-10-CM | POA: Diagnosis not present

## 2021-05-26 DIAGNOSIS — R82998 Other abnormal findings in urine: Secondary | ICD-10-CM | POA: Diagnosis not present

## 2021-05-26 DIAGNOSIS — E559 Vitamin D deficiency, unspecified: Secondary | ICD-10-CM | POA: Diagnosis not present

## 2021-05-26 DIAGNOSIS — Z1389 Encounter for screening for other disorder: Secondary | ICD-10-CM | POA: Diagnosis not present

## 2021-05-26 DIAGNOSIS — Z801 Family history of malignant neoplasm of trachea, bronchus and lung: Secondary | ICD-10-CM | POA: Diagnosis not present

## 2021-05-31 ENCOUNTER — Other Ambulatory Visit (HOSPITAL_COMMUNITY): Payer: Self-pay

## 2021-05-31 MED FILL — Tamsulosin HCl Cap 0.4 MG: ORAL | 90 days supply | Qty: 90 | Fill #2 | Status: AC

## 2021-06-15 DIAGNOSIS — C4491 Basal cell carcinoma of skin, unspecified: Secondary | ICD-10-CM | POA: Diagnosis not present

## 2021-06-15 DIAGNOSIS — C44319 Basal cell carcinoma of skin of other parts of face: Secondary | ICD-10-CM | POA: Diagnosis not present

## 2021-07-18 ENCOUNTER — Other Ambulatory Visit (HOSPITAL_COMMUNITY): Payer: Self-pay

## 2021-08-03 ENCOUNTER — Other Ambulatory Visit (HOSPITAL_COMMUNITY): Payer: Self-pay

## 2021-08-03 MED ORDER — MOLNUPIRAVIR 200 MG PO CAPS
ORAL_CAPSULE | ORAL | 0 refills | Status: DC
  Filled 2021-08-03: qty 40, 5d supply, fill #0

## 2021-08-10 MED FILL — Rosuvastatin Calcium Tab 10 MG: ORAL | 90 days supply | Qty: 90 | Fill #2 | Status: AC

## 2021-08-10 MED FILL — Mirtazapine Tab 15 MG: ORAL | 90 days supply | Qty: 90 | Fill #2 | Status: AC

## 2021-08-11 ENCOUNTER — Other Ambulatory Visit (HOSPITAL_COMMUNITY): Payer: Self-pay

## 2021-09-03 ENCOUNTER — Other Ambulatory Visit (HOSPITAL_COMMUNITY): Payer: Self-pay

## 2021-09-07 ENCOUNTER — Other Ambulatory Visit (HOSPITAL_COMMUNITY): Payer: Self-pay

## 2021-09-07 MED ORDER — TAMSULOSIN HCL 0.4 MG PO CAPS
ORAL_CAPSULE | ORAL | 3 refills | Status: DC
  Filled 2021-09-07: qty 90, 90d supply, fill #0

## 2021-09-08 ENCOUNTER — Other Ambulatory Visit (HOSPITAL_COMMUNITY): Payer: Self-pay

## 2021-11-13 ENCOUNTER — Other Ambulatory Visit (HOSPITAL_COMMUNITY): Payer: Self-pay

## 2021-11-17 ENCOUNTER — Other Ambulatory Visit (HOSPITAL_COMMUNITY): Payer: Self-pay

## 2021-11-21 ENCOUNTER — Other Ambulatory Visit (HOSPITAL_COMMUNITY): Payer: Self-pay

## 2021-11-21 MED ORDER — ROSUVASTATIN CALCIUM 10 MG PO TABS
10.0000 mg | ORAL_TABLET | Freq: Every day | ORAL | 2 refills | Status: DC
  Filled 2021-11-21: qty 90, 90d supply, fill #0

## 2021-11-21 MED ORDER — ROSUVASTATIN CALCIUM 10 MG PO TABS
ORAL_TABLET | ORAL | 3 refills | Status: DC
  Filled 2021-11-21: qty 90, 90d supply, fill #0
  Filled 2022-02-23: qty 90, 90d supply, fill #1
  Filled 2022-05-23: qty 90, 90d supply, fill #2
  Filled 2022-08-20: qty 90, 90d supply, fill #3

## 2021-11-21 MED ORDER — TAMSULOSIN HCL 0.4 MG PO CAPS
ORAL_CAPSULE | ORAL | 4 refills | Status: DC
  Filled 2021-11-21: qty 90, 90d supply, fill #0
  Filled 2022-02-14: qty 90, 90d supply, fill #1
  Filled 2022-05-23: qty 90, 90d supply, fill #2
  Filled 2022-09-07: qty 90, 90d supply, fill #3

## 2021-11-21 MED ORDER — MIRTAZAPINE 15 MG PO TABS
ORAL_TABLET | ORAL | 3 refills | Status: DC
  Filled 2021-11-21: qty 90, 90d supply, fill #0
  Filled 2022-02-14: qty 90, 90d supply, fill #1
  Filled 2022-05-23: qty 90, 90d supply, fill #2
  Filled 2022-08-20: qty 90, 90d supply, fill #3

## 2021-11-21 MED ORDER — MIRTAZAPINE 15 MG PO TABS
ORAL_TABLET | ORAL | 2 refills | Status: DC
  Filled 2021-11-21: qty 90, 90d supply, fill #0

## 2021-12-02 ENCOUNTER — Other Ambulatory Visit (HOSPITAL_COMMUNITY): Payer: Self-pay

## 2022-01-23 DIAGNOSIS — G4733 Obstructive sleep apnea (adult) (pediatric): Secondary | ICD-10-CM | POA: Diagnosis not present

## 2022-02-05 DIAGNOSIS — M7541 Impingement syndrome of right shoulder: Secondary | ICD-10-CM | POA: Diagnosis not present

## 2022-02-05 DIAGNOSIS — M9901 Segmental and somatic dysfunction of cervical region: Secondary | ICD-10-CM | POA: Diagnosis not present

## 2022-02-05 DIAGNOSIS — M9902 Segmental and somatic dysfunction of thoracic region: Secondary | ICD-10-CM | POA: Diagnosis not present

## 2022-02-05 DIAGNOSIS — M9907 Segmental and somatic dysfunction of upper extremity: Secondary | ICD-10-CM | POA: Diagnosis not present

## 2022-02-12 DIAGNOSIS — M9901 Segmental and somatic dysfunction of cervical region: Secondary | ICD-10-CM | POA: Diagnosis not present

## 2022-02-12 DIAGNOSIS — M9907 Segmental and somatic dysfunction of upper extremity: Secondary | ICD-10-CM | POA: Diagnosis not present

## 2022-02-12 DIAGNOSIS — M9902 Segmental and somatic dysfunction of thoracic region: Secondary | ICD-10-CM | POA: Diagnosis not present

## 2022-02-12 DIAGNOSIS — M7541 Impingement syndrome of right shoulder: Secondary | ICD-10-CM | POA: Diagnosis not present

## 2022-02-14 DIAGNOSIS — G4733 Obstructive sleep apnea (adult) (pediatric): Secondary | ICD-10-CM | POA: Diagnosis not present

## 2022-02-14 DIAGNOSIS — G4700 Insomnia, unspecified: Secondary | ICD-10-CM | POA: Diagnosis not present

## 2022-02-15 ENCOUNTER — Other Ambulatory Visit (HOSPITAL_COMMUNITY): Payer: Self-pay

## 2022-02-15 DIAGNOSIS — M9907 Segmental and somatic dysfunction of upper extremity: Secondary | ICD-10-CM | POA: Diagnosis not present

## 2022-02-15 DIAGNOSIS — M7541 Impingement syndrome of right shoulder: Secondary | ICD-10-CM | POA: Diagnosis not present

## 2022-02-15 DIAGNOSIS — M9901 Segmental and somatic dysfunction of cervical region: Secondary | ICD-10-CM | POA: Diagnosis not present

## 2022-02-15 DIAGNOSIS — M9902 Segmental and somatic dysfunction of thoracic region: Secondary | ICD-10-CM | POA: Diagnosis not present

## 2022-02-19 DIAGNOSIS — M9902 Segmental and somatic dysfunction of thoracic region: Secondary | ICD-10-CM | POA: Diagnosis not present

## 2022-02-19 DIAGNOSIS — M9907 Segmental and somatic dysfunction of upper extremity: Secondary | ICD-10-CM | POA: Diagnosis not present

## 2022-02-19 DIAGNOSIS — M9901 Segmental and somatic dysfunction of cervical region: Secondary | ICD-10-CM | POA: Diagnosis not present

## 2022-02-19 DIAGNOSIS — M7541 Impingement syndrome of right shoulder: Secondary | ICD-10-CM | POA: Diagnosis not present

## 2022-02-21 DIAGNOSIS — M9901 Segmental and somatic dysfunction of cervical region: Secondary | ICD-10-CM | POA: Diagnosis not present

## 2022-02-21 DIAGNOSIS — M9907 Segmental and somatic dysfunction of upper extremity: Secondary | ICD-10-CM | POA: Diagnosis not present

## 2022-02-21 DIAGNOSIS — M9902 Segmental and somatic dysfunction of thoracic region: Secondary | ICD-10-CM | POA: Diagnosis not present

## 2022-02-21 DIAGNOSIS — M7541 Impingement syndrome of right shoulder: Secondary | ICD-10-CM | POA: Diagnosis not present

## 2022-02-22 DIAGNOSIS — G4733 Obstructive sleep apnea (adult) (pediatric): Secondary | ICD-10-CM | POA: Diagnosis not present

## 2022-02-24 ENCOUNTER — Other Ambulatory Visit (HOSPITAL_COMMUNITY): Payer: Self-pay

## 2022-02-28 DIAGNOSIS — M7541 Impingement syndrome of right shoulder: Secondary | ICD-10-CM | POA: Diagnosis not present

## 2022-02-28 DIAGNOSIS — M9901 Segmental and somatic dysfunction of cervical region: Secondary | ICD-10-CM | POA: Diagnosis not present

## 2022-02-28 DIAGNOSIS — M9907 Segmental and somatic dysfunction of upper extremity: Secondary | ICD-10-CM | POA: Diagnosis not present

## 2022-02-28 DIAGNOSIS — M9902 Segmental and somatic dysfunction of thoracic region: Secondary | ICD-10-CM | POA: Diagnosis not present

## 2022-03-05 DIAGNOSIS — M9907 Segmental and somatic dysfunction of upper extremity: Secondary | ICD-10-CM | POA: Diagnosis not present

## 2022-03-05 DIAGNOSIS — M9901 Segmental and somatic dysfunction of cervical region: Secondary | ICD-10-CM | POA: Diagnosis not present

## 2022-03-05 DIAGNOSIS — M7541 Impingement syndrome of right shoulder: Secondary | ICD-10-CM | POA: Diagnosis not present

## 2022-03-05 DIAGNOSIS — M9902 Segmental and somatic dysfunction of thoracic region: Secondary | ICD-10-CM | POA: Diagnosis not present

## 2022-03-07 DIAGNOSIS — M7541 Impingement syndrome of right shoulder: Secondary | ICD-10-CM | POA: Diagnosis not present

## 2022-03-07 DIAGNOSIS — M9902 Segmental and somatic dysfunction of thoracic region: Secondary | ICD-10-CM | POA: Diagnosis not present

## 2022-03-07 DIAGNOSIS — M9901 Segmental and somatic dysfunction of cervical region: Secondary | ICD-10-CM | POA: Diagnosis not present

## 2022-03-07 DIAGNOSIS — M9907 Segmental and somatic dysfunction of upper extremity: Secondary | ICD-10-CM | POA: Diagnosis not present

## 2022-03-14 DIAGNOSIS — M9907 Segmental and somatic dysfunction of upper extremity: Secondary | ICD-10-CM | POA: Diagnosis not present

## 2022-03-14 DIAGNOSIS — M7541 Impingement syndrome of right shoulder: Secondary | ICD-10-CM | POA: Diagnosis not present

## 2022-03-14 DIAGNOSIS — M9901 Segmental and somatic dysfunction of cervical region: Secondary | ICD-10-CM | POA: Diagnosis not present

## 2022-03-14 DIAGNOSIS — M9902 Segmental and somatic dysfunction of thoracic region: Secondary | ICD-10-CM | POA: Diagnosis not present

## 2022-03-25 DIAGNOSIS — G4733 Obstructive sleep apnea (adult) (pediatric): Secondary | ICD-10-CM | POA: Diagnosis not present

## 2022-04-10 DIAGNOSIS — C4442 Squamous cell carcinoma of skin of scalp and neck: Secondary | ICD-10-CM | POA: Diagnosis not present

## 2022-04-10 DIAGNOSIS — C44612 Basal cell carcinoma of skin of right upper limb, including shoulder: Secondary | ICD-10-CM | POA: Diagnosis not present

## 2022-04-10 DIAGNOSIS — L814 Other melanin hyperpigmentation: Secondary | ICD-10-CM | POA: Diagnosis not present

## 2022-04-10 DIAGNOSIS — L821 Other seborrheic keratosis: Secondary | ICD-10-CM | POA: Diagnosis not present

## 2022-04-10 DIAGNOSIS — L820 Inflamed seborrheic keratosis: Secondary | ICD-10-CM | POA: Diagnosis not present

## 2022-04-10 DIAGNOSIS — Z85828 Personal history of other malignant neoplasm of skin: Secondary | ICD-10-CM | POA: Diagnosis not present

## 2022-04-10 DIAGNOSIS — L570 Actinic keratosis: Secondary | ICD-10-CM | POA: Diagnosis not present

## 2022-04-10 DIAGNOSIS — L578 Other skin changes due to chronic exposure to nonionizing radiation: Secondary | ICD-10-CM | POA: Diagnosis not present

## 2022-04-10 DIAGNOSIS — D225 Melanocytic nevi of trunk: Secondary | ICD-10-CM | POA: Diagnosis not present

## 2022-04-10 DIAGNOSIS — D485 Neoplasm of uncertain behavior of skin: Secondary | ICD-10-CM | POA: Diagnosis not present

## 2022-04-25 DIAGNOSIS — G4733 Obstructive sleep apnea (adult) (pediatric): Secondary | ICD-10-CM | POA: Diagnosis not present

## 2022-05-03 DIAGNOSIS — C44612 Basal cell carcinoma of skin of right upper limb, including shoulder: Secondary | ICD-10-CM | POA: Diagnosis not present

## 2022-05-17 DIAGNOSIS — C4442 Squamous cell carcinoma of skin of scalp and neck: Secondary | ICD-10-CM | POA: Diagnosis not present

## 2022-05-24 ENCOUNTER — Other Ambulatory Visit (HOSPITAL_COMMUNITY): Payer: Self-pay

## 2022-05-25 DIAGNOSIS — G4733 Obstructive sleep apnea (adult) (pediatric): Secondary | ICD-10-CM | POA: Diagnosis not present

## 2022-06-11 DIAGNOSIS — Z1212 Encounter for screening for malignant neoplasm of rectum: Secondary | ICD-10-CM | POA: Diagnosis not present

## 2022-06-11 DIAGNOSIS — Z125 Encounter for screening for malignant neoplasm of prostate: Secondary | ICD-10-CM | POA: Diagnosis not present

## 2022-06-11 DIAGNOSIS — E785 Hyperlipidemia, unspecified: Secondary | ICD-10-CM | POA: Diagnosis not present

## 2022-06-11 DIAGNOSIS — E559 Vitamin D deficiency, unspecified: Secondary | ICD-10-CM | POA: Diagnosis not present

## 2022-06-11 DIAGNOSIS — R7989 Other specified abnormal findings of blood chemistry: Secondary | ICD-10-CM | POA: Diagnosis not present

## 2022-06-19 DIAGNOSIS — Z0000 Encounter for general adult medical examination without abnormal findings: Secondary | ICD-10-CM | POA: Diagnosis not present

## 2022-06-19 DIAGNOSIS — E785 Hyperlipidemia, unspecified: Secondary | ICD-10-CM | POA: Diagnosis not present

## 2022-06-19 DIAGNOSIS — Z1331 Encounter for screening for depression: Secondary | ICD-10-CM | POA: Diagnosis not present

## 2022-06-19 DIAGNOSIS — R471 Dysarthria and anarthria: Secondary | ICD-10-CM | POA: Diagnosis not present

## 2022-06-19 DIAGNOSIS — R82998 Other abnormal findings in urine: Secondary | ICD-10-CM | POA: Diagnosis not present

## 2022-06-19 DIAGNOSIS — Z801 Family history of malignant neoplasm of trachea, bronchus and lung: Secondary | ICD-10-CM | POA: Diagnosis not present

## 2022-06-19 DIAGNOSIS — Z1339 Encounter for screening examination for other mental health and behavioral disorders: Secondary | ICD-10-CM | POA: Diagnosis not present

## 2022-06-19 DIAGNOSIS — M7630 Iliotibial band syndrome, unspecified leg: Secondary | ICD-10-CM | POA: Diagnosis not present

## 2022-06-19 DIAGNOSIS — H9190 Unspecified hearing loss, unspecified ear: Secondary | ICD-10-CM | POA: Diagnosis not present

## 2022-06-19 DIAGNOSIS — Z23 Encounter for immunization: Secondary | ICD-10-CM | POA: Diagnosis not present

## 2022-06-19 DIAGNOSIS — F329 Major depressive disorder, single episode, unspecified: Secondary | ICD-10-CM | POA: Diagnosis not present

## 2022-06-19 DIAGNOSIS — I2510 Atherosclerotic heart disease of native coronary artery without angina pectoris: Secondary | ICD-10-CM | POA: Diagnosis not present

## 2022-06-19 DIAGNOSIS — G4733 Obstructive sleep apnea (adult) (pediatric): Secondary | ICD-10-CM | POA: Diagnosis not present

## 2022-06-21 ENCOUNTER — Other Ambulatory Visit: Payer: Self-pay

## 2022-06-21 DIAGNOSIS — R471 Dysarthria and anarthria: Secondary | ICD-10-CM

## 2022-06-25 DIAGNOSIS — G4733 Obstructive sleep apnea (adult) (pediatric): Secondary | ICD-10-CM | POA: Diagnosis not present

## 2022-06-26 ENCOUNTER — Other Ambulatory Visit: Payer: Self-pay | Admitting: Internal Medicine

## 2022-06-26 DIAGNOSIS — R471 Dysarthria and anarthria: Secondary | ICD-10-CM

## 2022-06-27 ENCOUNTER — Ambulatory Visit
Admission: RE | Admit: 2022-06-27 | Discharge: 2022-06-27 | Disposition: A | Discharge status: Home / Self Care | Payer: Self-pay | Source: Ambulatory Visit | Attending: Internal Medicine | Admitting: Internal Medicine

## 2022-06-27 DIAGNOSIS — R471 Dysarthria and anarthria: Secondary | ICD-10-CM | POA: Diagnosis not present

## 2022-06-27 MED ORDER — GADOPICLENOL 0.5 MMOL/ML IV SOLN
7.5000 mL | Freq: Once | INTRAVENOUS | Status: AC | PRN
  Administered 2022-06-27: 7.5 mL via INTRAVENOUS

## 2022-07-25 DIAGNOSIS — G4733 Obstructive sleep apnea (adult) (pediatric): Secondary | ICD-10-CM | POA: Diagnosis not present

## 2022-08-20 ENCOUNTER — Other Ambulatory Visit (HOSPITAL_COMMUNITY): Payer: Self-pay

## 2022-08-20 ENCOUNTER — Other Ambulatory Visit: Payer: Self-pay

## 2022-08-20 MED ORDER — ESZOPICLONE 3 MG PO TABS
3.0000 mg | ORAL_TABLET | Freq: Every evening | ORAL | 3 refills | Status: DC
  Filled 2022-08-20: qty 30, 30d supply, fill #0
  Filled 2022-12-09: qty 30, 30d supply, fill #1

## 2022-08-22 ENCOUNTER — Other Ambulatory Visit (HOSPITAL_COMMUNITY): Payer: Self-pay

## 2022-08-23 DIAGNOSIS — C4441 Basal cell carcinoma of skin of scalp and neck: Secondary | ICD-10-CM | POA: Diagnosis not present

## 2022-08-23 DIAGNOSIS — D044 Carcinoma in situ of skin of scalp and neck: Secondary | ICD-10-CM | POA: Diagnosis not present

## 2022-08-23 DIAGNOSIS — D485 Neoplasm of uncertain behavior of skin: Secondary | ICD-10-CM | POA: Diagnosis not present

## 2022-08-25 DIAGNOSIS — G4733 Obstructive sleep apnea (adult) (pediatric): Secondary | ICD-10-CM | POA: Diagnosis not present

## 2022-09-07 ENCOUNTER — Other Ambulatory Visit (HOSPITAL_COMMUNITY): Payer: Self-pay

## 2022-09-25 DIAGNOSIS — G4733 Obstructive sleep apnea (adult) (pediatric): Secondary | ICD-10-CM | POA: Diagnosis not present

## 2022-10-15 DIAGNOSIS — L570 Actinic keratosis: Secondary | ICD-10-CM | POA: Diagnosis not present

## 2022-10-15 DIAGNOSIS — D044 Carcinoma in situ of skin of scalp and neck: Secondary | ICD-10-CM | POA: Diagnosis not present

## 2022-10-18 DIAGNOSIS — C4441 Basal cell carcinoma of skin of scalp and neck: Secondary | ICD-10-CM | POA: Diagnosis not present

## 2022-10-24 DIAGNOSIS — G4733 Obstructive sleep apnea (adult) (pediatric): Secondary | ICD-10-CM | POA: Diagnosis not present

## 2022-11-22 ENCOUNTER — Other Ambulatory Visit (HOSPITAL_COMMUNITY): Payer: Self-pay

## 2022-11-22 DIAGNOSIS — S76011S Strain of muscle, fascia and tendon of right hip, sequela: Secondary | ICD-10-CM | POA: Diagnosis not present

## 2022-11-22 MED ORDER — MELOXICAM 15 MG PO TABS
15.0000 mg | ORAL_TABLET | Freq: Every day | ORAL | 0 refills | Status: AC
  Filled 2022-11-22: qty 30, 30d supply, fill #0

## 2022-11-24 DIAGNOSIS — G4733 Obstructive sleep apnea (adult) (pediatric): Secondary | ICD-10-CM | POA: Diagnosis not present

## 2022-12-09 ENCOUNTER — Other Ambulatory Visit (HOSPITAL_COMMUNITY): Payer: Self-pay

## 2022-12-10 ENCOUNTER — Other Ambulatory Visit (HOSPITAL_COMMUNITY): Payer: Self-pay

## 2022-12-10 ENCOUNTER — Other Ambulatory Visit: Payer: Self-pay

## 2022-12-10 MED ORDER — ROSUVASTATIN CALCIUM 10 MG PO TABS
10.0000 mg | ORAL_TABLET | Freq: Every day | ORAL | 3 refills | Status: DC
  Filled 2022-12-10: qty 90, 90d supply, fill #0
  Filled 2023-03-07: qty 90, 90d supply, fill #1
  Filled 2023-06-03: qty 90, 90d supply, fill #2
  Filled 2023-09-02: qty 90, 90d supply, fill #3

## 2022-12-18 ENCOUNTER — Other Ambulatory Visit (HOSPITAL_COMMUNITY): Payer: Self-pay

## 2022-12-18 MED ORDER — MIRTAZAPINE 15 MG PO TABS
15.0000 mg | ORAL_TABLET | Freq: Every evening | ORAL | 3 refills | Status: DC
  Filled 2022-12-18 (×2): qty 90, 90d supply, fill #0
  Filled 2023-03-07: qty 90, 90d supply, fill #1
  Filled 2023-06-03: qty 90, 90d supply, fill #2
  Filled 2023-09-07: qty 90, 90d supply, fill #3

## 2022-12-21 ENCOUNTER — Other Ambulatory Visit (HOSPITAL_COMMUNITY): Payer: Self-pay

## 2022-12-24 ENCOUNTER — Other Ambulatory Visit (HOSPITAL_COMMUNITY): Payer: Self-pay

## 2022-12-24 DIAGNOSIS — G4733 Obstructive sleep apnea (adult) (pediatric): Secondary | ICD-10-CM | POA: Diagnosis not present

## 2022-12-24 MED ORDER — TAMSULOSIN HCL 0.4 MG PO CAPS
0.4000 mg | ORAL_CAPSULE | Freq: Every evening | ORAL | 3 refills | Status: DC
  Filled 2022-12-24: qty 90, 90d supply, fill #0
  Filled 2023-03-25: qty 90, 90d supply, fill #1
  Filled 2023-09-16: qty 90, 90d supply, fill #2
  Filled 2023-12-14: qty 90, 90d supply, fill #3

## 2022-12-25 ENCOUNTER — Other Ambulatory Visit (HOSPITAL_COMMUNITY): Payer: Self-pay

## 2022-12-25 MED ORDER — TAMSULOSIN HCL 0.4 MG PO CAPS
0.4000 mg | ORAL_CAPSULE | Freq: Every evening | ORAL | 3 refills | Status: DC
  Filled 2022-12-25 – 2023-06-03 (×2): qty 90, 90d supply, fill #0

## 2023-01-11 DIAGNOSIS — G4733 Obstructive sleep apnea (adult) (pediatric): Secondary | ICD-10-CM | POA: Diagnosis not present

## 2023-01-24 DIAGNOSIS — G4733 Obstructive sleep apnea (adult) (pediatric): Secondary | ICD-10-CM | POA: Diagnosis not present

## 2023-03-08 ENCOUNTER — Other Ambulatory Visit (HOSPITAL_COMMUNITY): Payer: Self-pay

## 2023-03-26 ENCOUNTER — Other Ambulatory Visit: Payer: Self-pay

## 2023-04-29 DIAGNOSIS — L578 Other skin changes due to chronic exposure to nonionizing radiation: Secondary | ICD-10-CM | POA: Diagnosis not present

## 2023-04-29 DIAGNOSIS — L570 Actinic keratosis: Secondary | ICD-10-CM | POA: Diagnosis not present

## 2023-04-29 DIAGNOSIS — L821 Other seborrheic keratosis: Secondary | ICD-10-CM | POA: Diagnosis not present

## 2023-04-29 DIAGNOSIS — D485 Neoplasm of uncertain behavior of skin: Secondary | ICD-10-CM | POA: Diagnosis not present

## 2023-04-29 DIAGNOSIS — C44612 Basal cell carcinoma of skin of right upper limb, including shoulder: Secondary | ICD-10-CM | POA: Diagnosis not present

## 2023-04-29 DIAGNOSIS — L814 Other melanin hyperpigmentation: Secondary | ICD-10-CM | POA: Diagnosis not present

## 2023-04-29 DIAGNOSIS — Z85828 Personal history of other malignant neoplasm of skin: Secondary | ICD-10-CM | POA: Diagnosis not present

## 2023-04-29 DIAGNOSIS — D225 Melanocytic nevi of trunk: Secondary | ICD-10-CM | POA: Diagnosis not present

## 2023-05-30 DIAGNOSIS — C44612 Basal cell carcinoma of skin of right upper limb, including shoulder: Secondary | ICD-10-CM | POA: Diagnosis not present

## 2023-06-03 ENCOUNTER — Other Ambulatory Visit (HOSPITAL_COMMUNITY): Payer: Self-pay

## 2023-06-03 ENCOUNTER — Other Ambulatory Visit: Payer: Self-pay

## 2023-06-11 ENCOUNTER — Other Ambulatory Visit (HOSPITAL_COMMUNITY): Payer: Self-pay

## 2023-08-27 DIAGNOSIS — E785 Hyperlipidemia, unspecified: Secondary | ICD-10-CM | POA: Diagnosis not present

## 2023-08-27 DIAGNOSIS — E559 Vitamin D deficiency, unspecified: Secondary | ICD-10-CM | POA: Diagnosis not present

## 2023-08-27 DIAGNOSIS — Z1212 Encounter for screening for malignant neoplasm of rectum: Secondary | ICD-10-CM | POA: Diagnosis not present

## 2023-08-27 DIAGNOSIS — I2510 Atherosclerotic heart disease of native coronary artery without angina pectoris: Secondary | ICD-10-CM | POA: Diagnosis not present

## 2023-08-27 DIAGNOSIS — Z125 Encounter for screening for malignant neoplasm of prostate: Secondary | ICD-10-CM | POA: Diagnosis not present

## 2023-09-03 ENCOUNTER — Other Ambulatory Visit (HOSPITAL_COMMUNITY): Payer: Self-pay

## 2023-09-03 DIAGNOSIS — Z0000 Encounter for general adult medical examination without abnormal findings: Secondary | ICD-10-CM | POA: Diagnosis not present

## 2023-09-03 DIAGNOSIS — I2510 Atherosclerotic heart disease of native coronary artery without angina pectoris: Secondary | ICD-10-CM | POA: Diagnosis not present

## 2023-09-03 DIAGNOSIS — F329 Major depressive disorder, single episode, unspecified: Secondary | ICD-10-CM | POA: Diagnosis not present

## 2023-09-03 DIAGNOSIS — H3530 Unspecified macular degeneration: Secondary | ICD-10-CM | POA: Diagnosis not present

## 2023-09-03 DIAGNOSIS — G4733 Obstructive sleep apnea (adult) (pediatric): Secondary | ICD-10-CM | POA: Diagnosis not present

## 2023-09-03 DIAGNOSIS — H9190 Unspecified hearing loss, unspecified ear: Secondary | ICD-10-CM | POA: Diagnosis not present

## 2023-09-03 DIAGNOSIS — E559 Vitamin D deficiency, unspecified: Secondary | ICD-10-CM | POA: Diagnosis not present

## 2023-09-03 DIAGNOSIS — E785 Hyperlipidemia, unspecified: Secondary | ICD-10-CM | POA: Diagnosis not present

## 2023-09-03 DIAGNOSIS — Z23 Encounter for immunization: Secondary | ICD-10-CM | POA: Diagnosis not present

## 2023-09-03 DIAGNOSIS — M7630 Iliotibial band syndrome, unspecified leg: Secondary | ICD-10-CM | POA: Diagnosis not present

## 2023-09-03 DIAGNOSIS — R413 Other amnesia: Secondary | ICD-10-CM | POA: Diagnosis not present

## 2023-09-03 DIAGNOSIS — G4700 Insomnia, unspecified: Secondary | ICD-10-CM | POA: Diagnosis not present

## 2023-09-03 MED ORDER — ESZOPICLONE 3 MG PO TABS
3.0000 mg | ORAL_TABLET | Freq: Every day | ORAL | 3 refills | Status: DC
  Filled 2023-09-03: qty 30, 30d supply, fill #0

## 2023-09-11 ENCOUNTER — Other Ambulatory Visit (HOSPITAL_COMMUNITY): Payer: Self-pay

## 2023-09-11 MED ORDER — OSELTAMIVIR PHOSPHATE 75 MG PO CAPS
75.0000 mg | ORAL_CAPSULE | Freq: Every day | ORAL | 0 refills | Status: DC
  Filled 2023-09-11: qty 10, 10d supply, fill #0

## 2023-09-16 DIAGNOSIS — Z1211 Encounter for screening for malignant neoplasm of colon: Secondary | ICD-10-CM | POA: Diagnosis not present

## 2023-09-22 LAB — EXTERNAL GENERIC LAB PROCEDURE: COLOGUARD: NEGATIVE

## 2023-09-22 LAB — COLOGUARD: COLOGUARD: NEGATIVE

## 2023-10-15 DIAGNOSIS — L578 Other skin changes due to chronic exposure to nonionizing radiation: Secondary | ICD-10-CM | POA: Diagnosis not present

## 2023-10-15 DIAGNOSIS — D225 Melanocytic nevi of trunk: Secondary | ICD-10-CM | POA: Diagnosis not present

## 2023-10-15 DIAGNOSIS — L821 Other seborrheic keratosis: Secondary | ICD-10-CM | POA: Diagnosis not present

## 2023-10-15 DIAGNOSIS — L814 Other melanin hyperpigmentation: Secondary | ICD-10-CM | POA: Diagnosis not present

## 2023-10-15 DIAGNOSIS — Z85828 Personal history of other malignant neoplasm of skin: Secondary | ICD-10-CM | POA: Diagnosis not present

## 2023-11-11 ENCOUNTER — Other Ambulatory Visit: Payer: Self-pay | Admitting: Family Medicine

## 2023-11-11 DIAGNOSIS — Z006 Encounter for examination for normal comparison and control in clinical research program: Secondary | ICD-10-CM

## 2023-11-18 DIAGNOSIS — H35371 Puckering of macula, right eye: Secondary | ICD-10-CM | POA: Diagnosis not present

## 2023-11-18 DIAGNOSIS — H524 Presbyopia: Secondary | ICD-10-CM | POA: Diagnosis not present

## 2023-11-18 DIAGNOSIS — H5203 Hypermetropia, bilateral: Secondary | ICD-10-CM | POA: Diagnosis not present

## 2023-11-18 DIAGNOSIS — H52223 Regular astigmatism, bilateral: Secondary | ICD-10-CM | POA: Diagnosis not present

## 2023-11-18 DIAGNOSIS — H43813 Vitreous degeneration, bilateral: Secondary | ICD-10-CM | POA: Diagnosis not present

## 2023-11-27 ENCOUNTER — Ambulatory Visit
Admission: RE | Admit: 2023-11-27 | Discharge: 2023-11-27 | Disposition: A | Discharge status: Home / Self Care | Source: Ambulatory Visit | Attending: Family Medicine | Admitting: Family Medicine

## 2023-11-27 DIAGNOSIS — Z006 Encounter for examination for normal comparison and control in clinical research program: Secondary | ICD-10-CM

## 2023-12-02 ENCOUNTER — Other Ambulatory Visit (HOSPITAL_COMMUNITY): Payer: Self-pay

## 2023-12-02 ENCOUNTER — Other Ambulatory Visit: Payer: Self-pay

## 2023-12-02 MED ORDER — ROSUVASTATIN CALCIUM 10 MG PO TABS
10.0000 mg | ORAL_TABLET | Freq: Every day | ORAL | 3 refills | Status: AC
  Filled 2023-12-02 – 2023-12-07 (×2): qty 90, 90d supply, fill #0
  Filled 2024-03-02: qty 90, 90d supply, fill #1
  Filled 2024-05-31: qty 90, 90d supply, fill #2
  Filled 2024-08-29: qty 90, 90d supply, fill #3

## 2023-12-02 MED ORDER — MIRTAZAPINE 15 MG PO TABS
15.0000 mg | ORAL_TABLET | Freq: Every evening | ORAL | 3 refills | Status: AC
  Filled 2023-12-02 – 2023-12-07 (×2): qty 90, 90d supply, fill #0
  Filled 2024-03-02: qty 90, 90d supply, fill #1
  Filled 2024-05-31: qty 90, 90d supply, fill #2
  Filled 2024-08-29: qty 90, 90d supply, fill #3

## 2023-12-03 ENCOUNTER — Other Ambulatory Visit (HOSPITAL_COMMUNITY): Payer: Self-pay

## 2023-12-03 ENCOUNTER — Encounter: Payer: Self-pay | Admitting: Pharmacist

## 2023-12-03 ENCOUNTER — Other Ambulatory Visit: Payer: Self-pay

## 2023-12-05 ENCOUNTER — Other Ambulatory Visit (HOSPITAL_COMMUNITY): Payer: Self-pay

## 2023-12-05 DIAGNOSIS — H43813 Vitreous degeneration, bilateral: Secondary | ICD-10-CM | POA: Diagnosis not present

## 2023-12-05 DIAGNOSIS — H524 Presbyopia: Secondary | ICD-10-CM | POA: Diagnosis not present

## 2023-12-05 DIAGNOSIS — H35371 Puckering of macula, right eye: Secondary | ICD-10-CM | POA: Diagnosis not present

## 2023-12-05 DIAGNOSIS — H52223 Regular astigmatism, bilateral: Secondary | ICD-10-CM | POA: Diagnosis not present

## 2023-12-05 DIAGNOSIS — H2513 Age-related nuclear cataract, bilateral: Secondary | ICD-10-CM | POA: Diagnosis not present

## 2023-12-05 DIAGNOSIS — H16142 Punctate keratitis, left eye: Secondary | ICD-10-CM | POA: Diagnosis not present

## 2023-12-05 DIAGNOSIS — H5203 Hypermetropia, bilateral: Secondary | ICD-10-CM | POA: Diagnosis not present

## 2023-12-05 MED ORDER — PREDNISOLONE ACETATE 1 % OP SUSP
1.0000 [drp] | Freq: Four times a day (QID) | OPHTHALMIC | 0 refills | Status: DC
  Filled 2023-12-05: qty 5, 25d supply, fill #0

## 2023-12-06 ENCOUNTER — Other Ambulatory Visit: Payer: Self-pay

## 2023-12-07 ENCOUNTER — Other Ambulatory Visit (HOSPITAL_COMMUNITY): Payer: Self-pay

## 2023-12-10 DIAGNOSIS — H43813 Vitreous degeneration, bilateral: Secondary | ICD-10-CM | POA: Diagnosis not present

## 2023-12-10 DIAGNOSIS — H524 Presbyopia: Secondary | ICD-10-CM | POA: Diagnosis not present

## 2023-12-10 DIAGNOSIS — H16142 Punctate keratitis, left eye: Secondary | ICD-10-CM | POA: Diagnosis not present

## 2023-12-10 DIAGNOSIS — H5203 Hypermetropia, bilateral: Secondary | ICD-10-CM | POA: Diagnosis not present

## 2023-12-10 DIAGNOSIS — H2513 Age-related nuclear cataract, bilateral: Secondary | ICD-10-CM | POA: Diagnosis not present

## 2023-12-10 DIAGNOSIS — H35371 Puckering of macula, right eye: Secondary | ICD-10-CM | POA: Diagnosis not present

## 2023-12-10 DIAGNOSIS — H52223 Regular astigmatism, bilateral: Secondary | ICD-10-CM | POA: Diagnosis not present

## 2024-02-10 DIAGNOSIS — M9907 Segmental and somatic dysfunction of upper extremity: Secondary | ICD-10-CM | POA: Diagnosis not present

## 2024-02-10 DIAGNOSIS — M9901 Segmental and somatic dysfunction of cervical region: Secondary | ICD-10-CM | POA: Diagnosis not present

## 2024-02-10 DIAGNOSIS — M7541 Impingement syndrome of right shoulder: Secondary | ICD-10-CM | POA: Diagnosis not present

## 2024-02-10 DIAGNOSIS — M9902 Segmental and somatic dysfunction of thoracic region: Secondary | ICD-10-CM | POA: Diagnosis not present

## 2024-02-12 DIAGNOSIS — M9901 Segmental and somatic dysfunction of cervical region: Secondary | ICD-10-CM | POA: Diagnosis not present

## 2024-02-12 DIAGNOSIS — M9907 Segmental and somatic dysfunction of upper extremity: Secondary | ICD-10-CM | POA: Diagnosis not present

## 2024-02-12 DIAGNOSIS — M9902 Segmental and somatic dysfunction of thoracic region: Secondary | ICD-10-CM | POA: Diagnosis not present

## 2024-02-12 DIAGNOSIS — M7541 Impingement syndrome of right shoulder: Secondary | ICD-10-CM | POA: Diagnosis not present

## 2024-02-25 DIAGNOSIS — M9902 Segmental and somatic dysfunction of thoracic region: Secondary | ICD-10-CM | POA: Diagnosis not present

## 2024-02-25 DIAGNOSIS — M9907 Segmental and somatic dysfunction of upper extremity: Secondary | ICD-10-CM | POA: Diagnosis not present

## 2024-02-25 DIAGNOSIS — M7541 Impingement syndrome of right shoulder: Secondary | ICD-10-CM | POA: Diagnosis not present

## 2024-02-25 DIAGNOSIS — M9901 Segmental and somatic dysfunction of cervical region: Secondary | ICD-10-CM | POA: Diagnosis not present

## 2024-03-03 DIAGNOSIS — M9902 Segmental and somatic dysfunction of thoracic region: Secondary | ICD-10-CM | POA: Diagnosis not present

## 2024-03-03 DIAGNOSIS — M9901 Segmental and somatic dysfunction of cervical region: Secondary | ICD-10-CM | POA: Diagnosis not present

## 2024-03-03 DIAGNOSIS — M9907 Segmental and somatic dysfunction of upper extremity: Secondary | ICD-10-CM | POA: Diagnosis not present

## 2024-03-03 DIAGNOSIS — M7541 Impingement syndrome of right shoulder: Secondary | ICD-10-CM | POA: Diagnosis not present

## 2024-03-16 DIAGNOSIS — M9907 Segmental and somatic dysfunction of upper extremity: Secondary | ICD-10-CM | POA: Diagnosis not present

## 2024-03-16 DIAGNOSIS — M9902 Segmental and somatic dysfunction of thoracic region: Secondary | ICD-10-CM | POA: Diagnosis not present

## 2024-03-16 DIAGNOSIS — M9901 Segmental and somatic dysfunction of cervical region: Secondary | ICD-10-CM | POA: Diagnosis not present

## 2024-03-16 DIAGNOSIS — M7541 Impingement syndrome of right shoulder: Secondary | ICD-10-CM | POA: Diagnosis not present

## 2024-03-23 DIAGNOSIS — M9902 Segmental and somatic dysfunction of thoracic region: Secondary | ICD-10-CM | POA: Diagnosis not present

## 2024-03-23 DIAGNOSIS — M7541 Impingement syndrome of right shoulder: Secondary | ICD-10-CM | POA: Diagnosis not present

## 2024-03-23 DIAGNOSIS — M9907 Segmental and somatic dysfunction of upper extremity: Secondary | ICD-10-CM | POA: Diagnosis not present

## 2024-03-23 DIAGNOSIS — M9901 Segmental and somatic dysfunction of cervical region: Secondary | ICD-10-CM | POA: Diagnosis not present

## 2024-03-30 DIAGNOSIS — M7541 Impingement syndrome of right shoulder: Secondary | ICD-10-CM | POA: Diagnosis not present

## 2024-03-30 DIAGNOSIS — M9901 Segmental and somatic dysfunction of cervical region: Secondary | ICD-10-CM | POA: Diagnosis not present

## 2024-03-30 DIAGNOSIS — M9907 Segmental and somatic dysfunction of upper extremity: Secondary | ICD-10-CM | POA: Diagnosis not present

## 2024-03-30 DIAGNOSIS — M9902 Segmental and somatic dysfunction of thoracic region: Secondary | ICD-10-CM | POA: Diagnosis not present

## 2024-04-06 DIAGNOSIS — M9907 Segmental and somatic dysfunction of upper extremity: Secondary | ICD-10-CM | POA: Diagnosis not present

## 2024-04-06 DIAGNOSIS — M9901 Segmental and somatic dysfunction of cervical region: Secondary | ICD-10-CM | POA: Diagnosis not present

## 2024-04-06 DIAGNOSIS — M9902 Segmental and somatic dysfunction of thoracic region: Secondary | ICD-10-CM | POA: Diagnosis not present

## 2024-04-06 DIAGNOSIS — M7541 Impingement syndrome of right shoulder: Secondary | ICD-10-CM | POA: Diagnosis not present

## 2024-04-07 ENCOUNTER — Other Ambulatory Visit (HOSPITAL_COMMUNITY): Payer: Self-pay

## 2024-04-08 ENCOUNTER — Other Ambulatory Visit (HOSPITAL_COMMUNITY): Payer: Self-pay

## 2024-04-08 MED ORDER — TAMSULOSIN HCL 0.4 MG PO CAPS
0.4000 mg | ORAL_CAPSULE | Freq: Every evening | ORAL | 3 refills | Status: AC
  Filled 2024-04-08: qty 90, 90d supply, fill #0
  Filled 2024-06-01 – 2024-07-01 (×2): qty 90, 90d supply, fill #1

## 2024-04-25 ENCOUNTER — Other Ambulatory Visit (HOSPITAL_COMMUNITY): Payer: Self-pay

## 2024-04-28 ENCOUNTER — Other Ambulatory Visit (HOSPITAL_COMMUNITY): Payer: Self-pay

## 2024-04-28 ENCOUNTER — Other Ambulatory Visit: Payer: Self-pay

## 2024-04-28 MED ORDER — ESZOPICLONE 3 MG PO TABS
3.0000 mg | ORAL_TABLET | Freq: Every day | ORAL | 0 refills | Status: DC
  Filled 2024-04-28: qty 30, 30d supply, fill #0

## 2024-05-06 DIAGNOSIS — L821 Other seborrheic keratosis: Secondary | ICD-10-CM | POA: Diagnosis not present

## 2024-05-06 DIAGNOSIS — L814 Other melanin hyperpigmentation: Secondary | ICD-10-CM | POA: Diagnosis not present

## 2024-05-06 DIAGNOSIS — L578 Other skin changes due to chronic exposure to nonionizing radiation: Secondary | ICD-10-CM | POA: Diagnosis not present

## 2024-05-06 DIAGNOSIS — L570 Actinic keratosis: Secondary | ICD-10-CM | POA: Diagnosis not present

## 2024-05-06 DIAGNOSIS — Z85828 Personal history of other malignant neoplasm of skin: Secondary | ICD-10-CM | POA: Diagnosis not present

## 2024-05-06 DIAGNOSIS — C44519 Basal cell carcinoma of skin of other part of trunk: Secondary | ICD-10-CM | POA: Diagnosis not present

## 2024-05-06 DIAGNOSIS — D485 Neoplasm of uncertain behavior of skin: Secondary | ICD-10-CM | POA: Diagnosis not present

## 2024-05-06 DIAGNOSIS — D225 Melanocytic nevi of trunk: Secondary | ICD-10-CM | POA: Diagnosis not present

## 2024-05-28 DIAGNOSIS — C44519 Basal cell carcinoma of skin of other part of trunk: Secondary | ICD-10-CM | POA: Diagnosis not present

## 2024-06-01 ENCOUNTER — Other Ambulatory Visit (HOSPITAL_COMMUNITY): Payer: Self-pay

## 2024-06-01 MED ORDER — ESZOPICLONE 3 MG PO TABS
3.0000 mg | ORAL_TABLET | Freq: Every day | ORAL | 2 refills | Status: DC
  Filled 2024-06-01: qty 30, 30d supply, fill #0

## 2024-06-11 DIAGNOSIS — H524 Presbyopia: Secondary | ICD-10-CM | POA: Diagnosis not present

## 2024-06-11 DIAGNOSIS — H52223 Regular astigmatism, bilateral: Secondary | ICD-10-CM | POA: Diagnosis not present

## 2024-06-11 DIAGNOSIS — H43813 Vitreous degeneration, bilateral: Secondary | ICD-10-CM | POA: Diagnosis not present

## 2024-06-11 DIAGNOSIS — H2513 Age-related nuclear cataract, bilateral: Secondary | ICD-10-CM | POA: Diagnosis not present

## 2024-06-11 DIAGNOSIS — H35341 Macular cyst, hole, or pseudohole, right eye: Secondary | ICD-10-CM | POA: Diagnosis not present

## 2024-07-01 DIAGNOSIS — M25511 Pain in right shoulder: Secondary | ICD-10-CM | POA: Diagnosis not present

## 2024-07-01 DIAGNOSIS — M25811 Other specified joint disorders, right shoulder: Secondary | ICD-10-CM | POA: Diagnosis not present

## 2024-09-02 ENCOUNTER — Encounter: Payer: Self-pay | Admitting: Neurology

## 2024-09-02 ENCOUNTER — Ambulatory Visit: Admitting: Neurology

## 2024-09-02 VITALS — BP 127/72 | HR 87 | Ht 68.0 in | Wt 165.0 lb

## 2024-09-02 DIAGNOSIS — R0683 Snoring: Secondary | ICD-10-CM | POA: Diagnosis not present

## 2024-09-02 DIAGNOSIS — G4737 Central sleep apnea in conditions classified elsewhere: Secondary | ICD-10-CM

## 2024-09-02 DIAGNOSIS — G4733 Obstructive sleep apnea (adult) (pediatric): Secondary | ICD-10-CM

## 2024-09-02 NOTE — Progress Notes (Signed)
 01/18/22 SET UP

## 2024-09-02 NOTE — Progress Notes (Signed)
 "  @[facility]   Provider:  Dedra Gores, MD  Primary Care Physician:  Shayne Anes, MD [address] [place] [address]  Referring Provider: Shayne Anes, Md [address] [place],  [place] [address]        Chief Concern for this Consultation:   Patient presents with     ESS 8  FSS 16     HPI: I have the pleasure of meeting with Scott Rhodes , on 09/02/24 , who is a 69 y.o.  male patient,  seen upon a referral by Dr Shayne for a new Sleep Medicine Consultation.  The patient's referral information asked for a new baseline and now CPAP .  Chief concern according to patient:  I think I need a new machine . This patient had been seen in 2018, for OSA and the quality of change has improved since being on CPAP, he tried FFM in the past ended up with a ResMed n 20 mask, and had been failed by a dental device.   Scott Lubrano presented with a medical history of : Past Medical History:  Diagnosis Date   Insomnia    Mild depression    Vitamin D deficiency OSA on CPAP tested  2018, moderate severe .         ENT surgery or problems: (Sinusitis, Tonsillectomy),  Sleep walking , PTSD, Trauma such as TBI/ whiplash,  Autoimmune disorders,  Anemia, RLS,  TMJ, GERD,  Thyroid  disease or other endocrinological disorders, Substance Abuse, Mood disorders, Depression.  This patient had a previous sleep study/ studies in the year 2018 HST  with a resulting diagnosis of moderate OSA . This patient has used the following therapies: CPAP , download below .    Family medical history: There are  biological family members affected by Sleep apnea ( late father, brother ), or Insomnia (/) , by excessive daytime sleepiness (/).     Social history: Part time employed  at the [employer] , he  is retired from editor, commissioning . He  lives in a private home, in a household with spouse , with 2 children having come back to the parental home-  several pets (). . The workplace involves physical activity, outdoor activity,   volunteers at [employer] too.  Nicotine use: /.  ETOH use: 3-4 / week ,  Caffeine intake in form of: Coffee (in AM ), Soft drinks (/), Tea ( /) or Energy drinks ( including those containing  taurine ). Caffeine is last consumed in AM  Exercises regularly 5.30- 6.30 AM.   Volunteering; yes , see above , ( member of a club, church or other community memberships and engagements).      Sleep habits and routines are as follows: The patient's dinner time is around 6 PM.  The patient goes to bed at, or close to, 9.30 PM. The bedroom is shared with spouse  and is described as cool, quiet, and dark. The patient reports that it takes 15 minutes to fall asleep, then continues to sleep for 7 hours, uninterrupted .   The preferred sleep position is supine, with support of 1 pillow, ( 20 degree  adjustable bed/ ). The total estimated sleep time is circa 7 hours.  Dreams are reportedly rare - very rare.Dream enactment has not been reported.   5 AM is the usual week- day rise time. The patient wakes up spontaneously and has a wall projection alarm clock (!)  Scott Rhodes,  reports mostly feeling refreshed and  restored in the morning.   No sleep paralysis has been experienced.  Naps in daytime are not  taken .   Review of Systems: Out of a complete 14 system review, the patient complains of only the following symptoms, and all other reviewed systems are negative.:    How likely are you to doze in the following situations: 0 = not likely, 1 = slight chance, 2 = moderate chance, 3 = high chance Sitting and Reading? Watching Television? Sitting inactive in a public place (theater or meeting)? As a passenger in a car for an hour without a break? Lying down in the afternoon when circumstances permit? Sitting and talking to someone? Sitting quietly after lunch without alcohol ? In a car, while stopped for a few minutes in traffic?   Total ESS =8 / 24 points.    FSS endorsed at 16/ 63 points.  GDS:  2/  15   Social History   Socioeconomic History   Marital status: Married    Spouse name: Not on file   Number of children: 3   Years of education: college   Highest education level: Not on file  Occupational History   Occupation: Retired   Tobacco Use   Smoking status: Never   Smokeless tobacco: Never  Substance and Sexual Activity   Alcohol  use: Yes   Drug use: No   Sexual activity: Not on file  Other Topics Concern   Not on file  Social History Narrative   Drinks 2 cups of coffee a day    Social Drivers of Health   Tobacco Use: Low Risk (09/02/2024)   Patient History    Smoking Tobacco Use: Never    Smokeless Tobacco Use: Never    Passive Exposure: Not on file  Financial Resource Strain: Not on file  Food Insecurity: Not on file  Transportation Needs: Not on file  Physical Activity: Not on file  Stress: Not on file  Social Connections: Not on file  Depression (EYV7-0): Not on file  Alcohol  Screen: Not on file  Housing: Not on file  Utilities: Not on file  Health Literacy: Not on file    History reviewed. No pertinent family history.  Past Medical History:  Diagnosis Date   Insomnia    Mild depression    Vitamin D deficiency     Past Surgical History:  Procedure Laterality Date   MOHS SURGERY     REFRACTIVE SURGERY     VASECTOMY       Medications Ordered Prior to Encounter[1]  Allergies[2]  Vitals:   09/02/24 1056  BP: 127/72  Pulse: 87     Physical exam:   General: The patient was alert and appears not in acute distress.  Mood and affect are appropriate .  The patient's interactions are: Cooperative, makes eye contact, follows the instructions and answers questions coherently.  The patient is groomed and appropriately groomed and dressed. Head: Normocephalic, atraumatic.  Neck is supple. Mallampati: 3.   Tongue feels swollen, phlegm in AM,  Tremulous tongue  The neck circumference measured 15 inches. Nasal airflow was patent ,     Dental status: intact  Cardiovascular:  Regular rate and cardiac rhythm by palpable pulse. Respiratory: no audible wheezing, no tachypnoea.   Skin:  Without evidence of ankle edema. No discoloration.  Trunk:  BMI is 25 The patient's posture was erect.   Neurologic exam : The patient was awake and alert, oriented to place and time.   Attention span & concentration ability  appeared normal.  Speech was fluent, without dysarthria, but positive dysphonia of low volume.     Cranial nerves:  There was no loss of smell or taste reported  Pupils are round, equal in size and briskly reactive to light.  Funduscopic exam was deferred.  Reading glasses in use.  Extraocular movements in vertical and horizontal planes were intact and without nystagmus. (No Diplopia reported). Visual fields by finger perimetry are intact. Hearing was impaired  to soft voice. - hearing aids in place.     Facial sensation intact to fine touch.  Facial motor strength: Symmetric movement and tongue and uvula move midline.  Neck ROM: rotation, tilt and flexion extension were intact for age and shoulder shrug was symmetrical.    Motor exam:  Symmetric bulk, strength .ROM ; limited for lifting left arm o above head level. .   Elevated left biceps tone -  tone with cog- wheeling, and symmetric grip strength ( strong !!) .  Reportedly had a shoulder injury on the left from throwing baseballs.    Sensory:  Fine touch and vibration were normal.   Coordination: The patient reported no problems with button closure and no changes to penmanship.     Gait and station: Patient could rise unassisted from a seated position, without bracing, and walked without assistive device.    Deep tendon reflexes: Upper extremities did show symmetric DTRs. Lower extremity DTRs were symmetric and brisk/    I would like to thank Shayne Anes, MD or allowing me to resume apnea care .   In short, Scott Rhodes  is presenting with known OSA and  uses CPAP with a N 30 nasal mask by [employer] , but his last baseline would be in 2018 and obtained by apnea link. He is a compliant user of CPAP.,  The residual AHI now consists only of CENTRAL apneas.  I reviewed his labs, no evidence of renal or  hepatic insufficiency no abnormal lung function history, no cardiac disease.  Risk factors for OSA were few : Body mass index is 25.09 kg/m.,  normal for gender neck size and  only risk factor lies in his upper airway anatomy. I noted a tremor on the tongue and elevated biceps muscle tone but no tremor.  Mild dysphonia.     My Plan is to proceed with:  HST/ 1) just to obtain a new baseline , will need a HST.  2) if positive for OSA , will  follow with CPAP at similar settings (7 cm water ) as current machine covers his needs well.     I plan to follow up personally or through our NP within 6 months.   A total time of 45  minutes consistent of a part of face to face encounter , exam and interview,  and additional preparation time for chart review was spent .  At today's visit, we discussed treatment options, associated risk and benefits, and engage in counseling as needed including, but not limited to:  central apnea causes,  no RLs and no REM BD reported- Sleep hygiene, Quality Sleep Habits, and Safety concerns for patients with daytime sleepiness who are warned to not operate machinery/ motor vehicles when drowsy.   Additionally, the following were reviewed: Past medical records, past medical and surgical history, family and social background, as well as relevant laboratory results, imaging findings, and medical notes, where applicable.  This note was generated by myself in part by using dictation software, and as a result, it  may contain unintentional typos and errors.  Nevertheless, effort was made to accurately convey the pertinent aspects of the patient's visit.   Dedra Gores, MD  09-02-2024  [facility]  Sleep Board certified in Sleep Medicine by [employer] and Diplomate of the [employer] (AASM) . Board certified In Neurology, Diplomat of the ABPN,  Fellow of the [employer].          [1]  Current Outpatient Medications on File Prior to Visit  Medication Sig Dispense Refill   meloxicam  (MOBIC ) 15 MG tablet Take 1 tablet (15 mg total) by mouth daily. 30 tablet 0   mirtazapine  (REMERON ) 15 MG tablet Take 1 tablet (15 mg total) by mouth every evening at bedtime (Patient taking differently: Take 7.5 mg by mouth every evening.) 90 tablet 3   rosuvastatin  (CRESTOR ) 10 MG tablet Take 1 tablet (10 mg total) by mouth daily. 90 tablet 3   tamsulosin  (FLOMAX ) 0.4 MG CAPS capsule Take 1 capsule (0.4 mg total) by mouth every evening or every other evening as directed.. 90 capsule 3   No current facility-administered medications on file prior to visit.  [2] No Known Allergies  "

## 2024-09-02 NOTE — Patient Instructions (Signed)
 I would like to thank Shayne Anes, MD or allowing me to resume apnea care .     In short, Scott Rhodes  is presenting with known OSA and uses CPAP with a N 30 nasal mask by [employer] , but his last baseline would be in 2018 and obtained by apnea link. He is a compliant user of CPAP.,  The residual AHI now consists only of CENTRAL apneas.  I reviewed his labs, no evidence of renal or  hepatic insufficiency no abnormal lung function history, no cardiac disease.  Risk factors for OSA were few : Body mass index is 25.09 kg/m.,  normal for gender neck size and  only risk factor lies in his upper airway anatomy. I noted a tremor on the tongue and elevated biceps muscle tone but no tremor.  Mild dysphonia.      My Plan is to proceed with:   HST/ 1) just to obtain a new baseline , will need a HST.  2) if positive for OSA , will  follow with CPAP at similar settings (7 cm water ) as current machine covers his needs well.      I plan to follow up personally or through our NP within 4-6 months.    A total time of 45  minutes consistent of a part of face to face encounter , exam and interview,  and additional preparation time for chart review was spent .  At today's visit, we discussed treatment options, associated risk and benefits, and engage in counseling as needed including, but not limited to:  central apnea causes,  no RLs and no REM BD reported- Sleep hygiene, Quality Sleep Habits, and Safety concerns for patients with daytime sleepiness who are warned to not operate machinery/ motor vehicles when drowsy.     Additionally, the following were reviewed: Past medical records, past medical and surgical history, family and social background, as well as relevant laboratory results, imaging findings, and medical notes, where applicable.  This note was generated by myself in part by using dictation software, and as a result, it may contain unintentional typos and errors.  Nevertheless, effort was made to  accurately convey the pertinent aspects of the patient's visit.    Dedra Gores, MD  09-02-2024

## 2024-09-11 ENCOUNTER — Encounter

## 2024-09-11 DIAGNOSIS — T8189XA Other complications of procedures, not elsewhere classified, initial encounter: Secondary | ICD-10-CM

## 2024-09-11 DIAGNOSIS — R0683 Snoring: Secondary | ICD-10-CM

## 2024-09-11 DIAGNOSIS — G4733 Obstructive sleep apnea (adult) (pediatric): Secondary | ICD-10-CM

## 2024-09-15 ENCOUNTER — Other Ambulatory Visit: Payer: Self-pay | Admitting: Medical Genetics

## 2024-09-29 ENCOUNTER — Other Ambulatory Visit (HOSPITAL_COMMUNITY): Payer: Self-pay

## 2025-02-03 ENCOUNTER — Ambulatory Visit: Admitting: Adult Health
